# Patient Record
Sex: Male | Born: 1995
Health system: Southern US, Community
[De-identification: ages and names within clinical notes are randomized; demographics above are authoritative.]

## PROBLEM LIST (undated history)

## (undated) DIAGNOSIS — M5430 Sciatica, unspecified side: Secondary | ICD-10-CM

## (undated) DIAGNOSIS — K649 Unspecified hemorrhoids: Secondary | ICD-10-CM

## (undated) DIAGNOSIS — Z8781 Personal history of (healed) traumatic fracture: Secondary | ICD-10-CM

## (undated) DIAGNOSIS — Z87820 Personal history of traumatic brain injury: Secondary | ICD-10-CM

## (undated) DIAGNOSIS — M519 Unspecified thoracic, thoracolumbar and lumbosacral intervertebral disc disorder: Secondary | ICD-10-CM

## (undated) DIAGNOSIS — M5432 Sciatica, left side: Secondary | ICD-10-CM

## (undated) DIAGNOSIS — K219 Gastro-esophageal reflux disease without esophagitis: Secondary | ICD-10-CM

## (undated) DIAGNOSIS — M545 Low back pain, unspecified: Secondary | ICD-10-CM

## (undated) HISTORY — DX: Personal history of (healed) traumatic fracture: Z87.81

## (undated) HISTORY — DX: Unspecified hemorrhoids: K64.9

## (undated) HISTORY — DX: Low back pain, unspecified: M54.50

## (undated) HISTORY — DX: Sciatica, left side: M54.32

## (undated) HISTORY — DX: Personal history of traumatic brain injury: Z87.820

## (undated) HISTORY — DX: Gastro-esophageal reflux disease without esophagitis: K21.9

## (undated) HISTORY — DX: Sciatica, unspecified side: M54.30

## (undated) HISTORY — DX: Unspecified thoracic, thoracolumbar and lumbosacral intervertebral disc disorder: M51.9

## (undated) HISTORY — PX: TOOTH EXTRACTION: SUR596

---

## 2017-11-18 HISTORY — PX: HEMORRHOID SURGERY: SHX153

## 2020-11-21 ENCOUNTER — Other Ambulatory Visit: Payer: Self-pay

## 2020-11-21 ENCOUNTER — Other Ambulatory Visit (HOSPITAL_COMMUNITY): Payer: Self-pay | Admitting: Family Medicine

## 2020-11-21 ENCOUNTER — Other Ambulatory Visit (HOSPITAL_COMMUNITY)
Admission: RE | Admit: 2020-11-21 | Discharge: 2020-11-21 | Disposition: A | Discharge status: Home / Self Care | Payer: Medicaid Other | Source: Ambulatory Visit | Attending: Family Medicine | Admitting: Family Medicine

## 2020-11-21 ENCOUNTER — Ambulatory Visit (INDEPENDENT_AMBULATORY_CARE_PROVIDER_SITE_OTHER): Payer: Medicaid Other | Admitting: Family Medicine

## 2020-11-21 VITALS — BP 110/80 | HR 81 | Ht 67.5 in | Wt 136.2 lb

## 2020-11-21 DIAGNOSIS — Z0289 Encounter for other administrative examinations: Secondary | ICD-10-CM | POA: Insufficient documentation

## 2020-11-21 DIAGNOSIS — M5442 Lumbago with sciatica, left side: Secondary | ICD-10-CM | POA: Diagnosis not present

## 2020-11-21 DIAGNOSIS — R1013 Epigastric pain: Secondary | ICD-10-CM

## 2020-11-21 DIAGNOSIS — G8929 Other chronic pain: Secondary | ICD-10-CM | POA: Diagnosis not present

## 2020-11-21 LAB — POCT URINALYSIS DIP (MANUAL ENTRY)
Bilirubin, UA: NEGATIVE
Blood, UA: NEGATIVE
Glucose, UA: NEGATIVE mg/dL
Ketones, POC UA: NEGATIVE mg/dL
Leukocytes, UA: NEGATIVE
Nitrite, UA: NEGATIVE
Protein Ur, POC: NEGATIVE mg/dL
Spec Grav, UA: >=1.03 — AB (ref 1.010–1.025)
Urobilinogen, UA: 0.2 E.U./dL
pH, UA: 5.5 (ref 5.0–8.0)

## 2020-11-21 MED ORDER — ALBENDAZOLE 200 MG PO TABS: 400.0000 mg | ORAL_TABLET | Freq: Once | ORAL | 0 refills | Status: AC

## 2020-11-21 MED ORDER — ACETAMINOPHEN ER 650 MG PO TBCR: 650.0000 mg | EXTENDED_RELEASE_TABLET | Freq: Three times a day (TID) | ORAL | 3 refills | Status: DC | PRN

## 2020-11-21 MED ORDER — DICLOFENAC SODIUM 1 % EX CREA: 2.0000 g | TOPICAL_CREAM | Freq: Three times a day (TID) | CUTANEOUS | 3 refills | Status: DC | PRN

## 2020-11-21 MED ORDER — IVERMECTIN 3 MG PO TABS: 200.0000 ug/kg | ORAL_TABLET | Freq: Every day | ORAL | 0 refills | Status: AC

## 2020-11-21 NOTE — Patient Instructions (Addendum)
It was wonderful to see you today.  Please bring ALL of your medications with you to every visit.   Today we talked about:  --Going to the lab    ---Checking for infection of the stomach    ? ?  ?  ?  ?.   ~?  ??   ?   ?  ?.     ? ? ? ~??:  --   ?   ---  ?  ?~ ? ~  Thank you for choosing [facility].   Please call [phone] with any questions about today's appointment.  Please be sure to schedule follow up at the front  desk before you leave today.     ? ~?   ~  .   ~?   ?? ??  ?  ? ??  [phone]  ? .   ~? ??  ~? ?  ?     ~ ? ~? ? ? ~?.  Terisa Starr, MD  Family Medicine

## 2020-11-21 NOTE — Assessment & Plan Note (Addendum)
Routine labs today. Follow-up appointment 12/04/2020 to address health concerns. Offered resources for mental health concerns, patient declines at this time.
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
## 2020-11-22 LAB — CBC WITH DIFFERENTIAL/PLATELET
Basophils Absolute: 0 10*3/uL (ref 0.0–0.2)
Basos: 0 %
EOS (ABSOLUTE): 0.3 10*3/uL (ref 0.0–0.4)
Eos: 4 %
Hematocrit: 46.6 % (ref 37.5–51.0)
Hemoglobin: 16.3 g/dL (ref 13.0–17.7)
Immature Grans (Abs): 0 10*3/uL (ref 0.0–0.1)
Immature Granulocytes: 1 %
Lymphocytes Absolute: 2.8 10*3/uL (ref 0.7–3.1)
Lymphs: 43 %
MCH: 29.8 pg (ref 26.6–33.0)
MCHC: 35 g/dL (ref 31.5–35.7)
MCV: 85 fL (ref 79–97)
Monocytes Absolute: 0.4 10*3/uL (ref 0.1–0.9)
Monocytes: 7 %
Neutrophils Absolute: 3 10*3/uL (ref 1.4–7.0)
Neutrophils: 45 %
Platelets: 224 10*3/uL (ref 150–450)
RBC: 5.47 x10E6/uL (ref 4.14–5.80)
RDW: 12.5 % (ref 11.6–15.4)
WBC: 6.5 10*3/uL (ref 3.4–10.8)

## 2020-11-22 LAB — COMPREHENSIVE METABOLIC PANEL
ALT: 27 IU/L (ref 0–44)
AST: 22 IU/L (ref 0–40)
Albumin/Globulin Ratio: 1.6 (ref 1.2–2.2)
Albumin: 4.7 g/dL (ref 4.1–5.2)
Alkaline Phosphatase: 59 IU/L (ref 44–121)
BUN/Creatinine Ratio: 15 (ref 9–20)
BUN: 11 mg/dL (ref 6–20)
Bilirubin Total: 0.3 mg/dL (ref 0.0–1.2)
CO2: 24 mmol/L (ref 20–29)
Calcium: 9.7 mg/dL (ref 8.7–10.2)
Chloride: 102 mmol/L (ref 96–106)
Creatinine, Ser: 0.73 mg/dL — ABNORMAL LOW (ref 0.76–1.27)
GFR calc Af Amer: 144 mL/min/{1.73_m2} (ref >59)
GFR calc non Af Amer: 124 mL/min/{1.73_m2} (ref >59)
Globulin, Total: 2.9 g/dL (ref 1.5–4.5)
Glucose: 88 mg/dL (ref 65–99)
Potassium: 4.9 mmol/L (ref 3.5–5.2)
Sodium: 140 mmol/L (ref 134–144)
Total Protein: 7.6 g/dL (ref 6.0–8.5)

## 2020-11-22 LAB — HEPATITIS B SURFACE ANTIBODY, QUANTITATIVE: Hepatitis B Surf Ab Quant: <3.1 m[IU]/mL — ABNORMAL LOW (ref >9.9)

## 2020-11-22 LAB — LIPID PANEL
Chol/HDL Ratio: 4.3 ratio (ref 0.0–5.0)
Cholesterol, Total: 173 mg/dL (ref 100–199)
HDL: 40 mg/dL (ref >39)
LDL Chol Calc (NIH): 100 mg/dL — ABNORMAL HIGH (ref 0–99)
Triglycerides: 189 mg/dL — ABNORMAL HIGH (ref 0–149)
VLDL Cholesterol Cal: 33 mg/dL (ref 5–40)

## 2020-11-22 LAB — VARICELLA ZOSTER ANTIBODY, IGG: Varicella zoster IgG: 1245 index (ref >165)

## 2020-11-22 LAB — H. PYLORI BREATH TEST: H pylori Breath Test: POSITIVE — AB

## 2020-11-22 LAB — HEPATITIS B CORE ANTIBODY, TOTAL: Hep B Core Total Ab: NEGATIVE

## 2020-11-22 LAB — HCV AB W REFLEX TO QUANT PCR: HCV Ab: <0.1 s/co ratio (ref 0.0–0.9)

## 2020-11-22 LAB — HIV ANTIBODY (ROUTINE TESTING W REFLEX): HIV Screen 4th Generation wRfx: NONREACTIVE

## 2020-11-22 LAB — HEPATITIS B SURFACE ANTIGEN: Hepatitis B Surface Ag: NEGATIVE

## 2020-11-22 LAB — TSH: TSH: 2.72 u[IU]/mL (ref 0.450–4.500)

## 2020-11-22 LAB — RPR: RPR Ser Ql: NONREACTIVE

## 2020-11-22 LAB — HCV INTERPRETATION

## 2020-11-23 LAB — URINE CYTOLOGY ANCILLARY ONLY
Chlamydia: NEGATIVE
Comment: NEGATIVE
Comment: NORMAL
Neisseria Gonorrhea: NEGATIVE

## 2020-11-24 ENCOUNTER — Telehealth: Payer: Self-pay | Admitting: Family Medicine

## 2020-11-24 DIAGNOSIS — A048 Other specified bacterial intestinal infections: Secondary | ICD-10-CM | POA: Insufficient documentation

## 2020-11-24 HISTORY — DX: Other specified bacterial intestinal infections: A04.8

## 2020-11-24 NOTE — Telephone Encounter (Signed)
Attempted to call-- H. Pylori positive, no Pashto interpreter available.  Will attempt to call next week with results. Will need to confirm allergies and prior experience with macrolides.   Terisa Starr, MD  Family Medicine Teaching Service

## 2020-11-29 ENCOUNTER — Encounter: Payer: Self-pay | Admitting: Family Medicine

## 2020-11-30 ENCOUNTER — Telehealth: Payer: Self-pay | Admitting: Family Medicine

## 2020-11-30 ENCOUNTER — Encounter: Payer: Self-pay | Admitting: Family Medicine

## 2020-11-30 DIAGNOSIS — A048 Other specified bacterial intestinal infections: Secondary | ICD-10-CM

## 2020-11-30 MED ORDER — OMEPRAZOLE 20 MG PO CPDR
20.0000 mg | DELAYED_RELEASE_CAPSULE | Freq: Two times a day (BID) | ORAL | 0 refills | Status: DC
Start: 2020-11-30 — End: 2020-12-08

## 2020-11-30 MED ORDER — PYLERA 140-125-125 MG PO CAPS: 3.0000 | ORAL_CAPSULE | Freq: Three times a day (TID) | ORAL | 0 refills | Status: DC

## 2020-11-30 NOTE — Telephone Encounter (Signed)
Attempted to use phone interpreter service to call patient regarding H. Pylori positive results.  Unfortunately, after over 20 minutes of holding, no Pashto interpreter was available. Wanted to confirm patient's allergies prior to prescribing bismuth quadruple therapy for H. Pylori infection.  Per documentation from initial visit on 11/21/2020, patient reported having no allergies to foods or medicines.   I will proceed with prescribing bismuth quadruple therapy with Pylera combo pill TID with meals plus QHS and omeprazole BID. Will send letter in English and Pashto (per google translate) explaining therapy regimen. I am unable to write medication bottle instructions in Pashto unfortunately.  I have contacted the pharmacy about printing their medication instructions in Pashto; they are unable to do so.   Fayette Pho, MD

## 2020-11-30 NOTE — Progress Notes (Signed)
Results letter covering positive H. Pylori test result and treatment. Typed in [place], used Google translation to attempt written Pashto translation. Unfortunately, [facility] system does not yet have written translation services.   Fayette Pho, MD

## 2020-12-01 ENCOUNTER — Telehealth: Payer: Self-pay | Admitting: Family Medicine

## 2020-12-01 NOTE — Telephone Encounter (Signed)
Attempted to use the interpreter line again to have Pashto interpreter to explain positive H. Pylori results and treatment regimen. No Pashto interpreter available at this time. Per interpreter service, the only times of day they are more available are "really early or really late" due to high demand.   Will try again later. Letter in both [place] and [place] (courtesy of Google translate) mailed yesterday 1/13.  Fayette Pho, MD

## 2020-12-04 ENCOUNTER — Ambulatory Visit: Payer: Medicaid Other | Admitting: Family Medicine

## 2020-12-08 ENCOUNTER — Other Ambulatory Visit: Payer: Self-pay

## 2020-12-08 ENCOUNTER — Encounter: Payer: Self-pay | Admitting: Family Medicine

## 2020-12-08 ENCOUNTER — Ambulatory Visit (INDEPENDENT_AMBULATORY_CARE_PROVIDER_SITE_OTHER): Payer: Medicaid Other | Admitting: Family Medicine

## 2020-12-08 ENCOUNTER — Other Ambulatory Visit (HOSPITAL_COMMUNITY): Payer: Self-pay | Admitting: Family Medicine

## 2020-12-08 DIAGNOSIS — M5442 Lumbago with sciatica, left side: Secondary | ICD-10-CM | POA: Diagnosis present

## 2020-12-08 DIAGNOSIS — Z23 Encounter for immunization: Secondary | ICD-10-CM | POA: Diagnosis not present

## 2020-12-08 DIAGNOSIS — G8929 Other chronic pain: Secondary | ICD-10-CM

## 2020-12-08 DIAGNOSIS — Z789 Other specified health status: Secondary | ICD-10-CM

## 2020-12-08 DIAGNOSIS — A048 Other specified bacterial intestinal infections: Secondary | ICD-10-CM | POA: Diagnosis not present

## 2020-12-08 DIAGNOSIS — Z0289 Encounter for other administrative examinations: Secondary | ICD-10-CM | POA: Diagnosis not present

## 2020-12-08 MED ORDER — OMEPRAZOLE 20 MG PO CPDR: 20.0000 mg | DELAYED_RELEASE_CAPSULE | Freq: Two times a day (BID) | ORAL | 0 refills | Status: DC

## 2020-12-08 MED ORDER — ACETAMINOPHEN ER 650 MG PO TBCR: 650.0000 mg | EXTENDED_RELEASE_TABLET | Freq: Three times a day (TID) | ORAL | 3 refills | Status: DC | PRN

## 2020-12-08 MED ORDER — DICLOFENAC SODIUM 1 % EX CREA: 2.0000 g | TOPICAL_CREAM | Freq: Three times a day (TID) | CUTANEOUS | 3 refills | Status: DC | PRN

## 2020-12-08 MED ORDER — PYLERA 140-125-125 MG PO CAPS: 3.0000 | ORAL_CAPSULE | Freq: Three times a day (TID) | ORAL | 0 refills | Status: DC

## 2020-12-08 NOTE — Patient Instructions (Addendum)
It was wonderful to see you today. Thank you for allowing me to be a part of your care. Below is a short summary of what we discussed at your visit today:    ? ?  ?  ?  ?.  ?    ~? ?   ?  . ?   ? ?? ? ? ?    ? ~?  ~?:  Hepatitis B vaccines Since your body did not respond appropriately to the previous hepatitis B vaccines, we have to restart the vaccine series.  You got your first dose of the hepatitis B vaccine.  There will be 3 vaccines total; you will need to come back in 1 month for the second shot and again in 6 months for the third shot.   ? ? ~? ?? ?    ? ? ? ~?   ?  ? ~?? ? ?  ~? ?? ? ? ~?.   ?? ? ~? ??   ~?. ??  ? ~?   ??    ?   1 ? ~? ?   ?  6 ? ~?  ? ? .  Future Appointments ~ ?? Your wife has her next appointment on Thursday, February 3 at 1:45 PM.     ?  ?  ?    3  ??  1:45     .  Both you and your wife both have appointments on Thursday, February 24 starting at 1:45 PM.  At this appointment for you, we will follow-up on your H. pylori medicine and give you your next hepatitis B vaccine.    ? ?  ?  ?     24 ??  ??  1:45 ? ~??.    ?  ~? ?    H. pylori  ? ~?      ~?  ?? ? ~? ~?.  You will need to come back in 6 months in July or August for your last hepatitis B vaccine.  We do not yet have her schedule open that far in advance.  You will need to schedule this appointment sometime in May.  ??  ?    ?? ? ~?   ? ? ? ~?  6 ? ~? ? . ?     ? ?  ?  ~??.     ?  ? ~? ? ?    ?  ?? .  Medications I have sent several medications to your pharmacy.  You will also take the paper prescriptions to the pharmacy as well.  Today, you will be able to pick up the omeprazole, diclofenac cream, and Tylenol.  The other medicines must be ordered and the pharmacy will call you to let you know when they are here.  The medicines they need to order are albendazole, ivermectin, and Pylera.       ?  ?? .    ~ ?    .  ?  ~?  ? ??~??~ ~?  ??? .   ?        ?  ?    ~? ~ ? ?  .   ? ? ??  ~  ??  ? ivermectin  Pylera .  To pick up the medications at the pharmacy today, I have printed out a letter in both [place] and Pashto that you may simply handed to the pharmacist.  This will explain who you are and what prescriptions you need.  The  pharmacy can waive your co-pay for many medications.  This means you will be able to go home with your medications at no cost to you.  I have also given you an envelope that has the money to pay for the pain reliever called Tylenol.   ?   ~?   ?    ??  ? ? ~? ? ?~  ~?? ?  ~?    ?  ?~  ~?.   ? ~? ?  ?~ ?  ~ ?  ?? .  ~?   ???    ?~ ?  ~?.  ?   ?   ~?    ~ ?? ?     ~  ? .    ?   ~?? ?  ???     ~  ?? .  Below are instructions on how to take each medicine.  ? ??  ? ??   .  H. pylori infection You will need to follow the instructions on the letter to take these medicines.  You must wait until you get both the omeprazole and the Pylera and  start them on the same day.  H. pylori ?~  ??   ?  ?   ?~ ~? ?? ? ~?.  ?  ~?   ?  ?  ? ?  ~?    ? ?? ? ~?.  If you have any questions or concerns, please do not hesitate to contact us via phone or MyChart message.  ~  ~ ? ? ??   ~?  ? ? MyChart ?  ? ?  ??~ ?. Fayette Pho, MD   Tylenol is a pain reliever pill that you take by mouth.  You may take 1 tablet every 6 hours as needed for pain.  Do not take more than 3 tablets in 1 day.  ???   ~ ??  ?  ??  ?  .  ~?    6  ~? 1 ???  ~ ??    .  ? ? ~?  3 ? ?? ???  .  Diclofenac cream is a pain reliever that is applied to the skin.  You will apply it to your painful areas like your back and your knees twice daily.  You must do this every day to get relief.  Use for approximately 2 weeks at a time.  ??~??~ ~? ?  ~~? ? ?  ?~  ~??.     ? ~?  ?  ~ ? ~    ? ~?  ~?.  ?  ?  ?  ~   ~ ~?.  ?  ~?  ?? 2 ?  ~.  Albendazole you will take 2 tablets by mouth once.  That is all.   Albendazole   2 ??   ~? ? ? .  ? .  Ivermectin you will take 5 tablets by mouth today and 5 tablets by mouth tomorrow.  Since your wife weighs less than you, she will take 4 tablets by mouth today and 4 tablets by mouth tomorrow.  Ivermectin    5 ??  ?  ?  5 ??   ?  ? . ?? ?  ??    ~      4 ??  ?  ?    ?  ? 4 ?? .

## 2020-12-13 DIAGNOSIS — Z789 Other specified health status: Secondary | ICD-10-CM

## 2020-12-13 HISTORY — DX: Other specified health status: Z78.9

## 2020-12-13 NOTE — Assessment & Plan Note (Signed)
Discussed with patient, he is amenable to repeating Hep. B vaccine series. Administered initial vaccine at this visit. Will return in one month and six months for additional series doses.

## 2020-12-13 NOTE — Progress Notes (Signed)
    SUBJECTIVE:   CHIEF COMPLAINT / HPI:   Active H. Pylori infection Patient presents for follow up from his initial refugee health examination. At that time, he complained of abdominal pain and dyspepsia; he subsequently had a positive breath test for H. Pylori. Multiple attempts to call him via phone with interpreter were foiled due to unavailability of Pashto interpreter. A letter was sent to his home in Pashto explaining the treatment regimen; however, he has not yet received this letter. With the interpreter, I was able to explain the positive test findings and the 10-day treatment regimen.   Low Hepatitis B titers At his initial testing, his Hepatitis B antibody titers were low despite previous vaccination. He will need to restart the Hep B vaccine series today and return in one month and six months for the rest of the series. This was discussed with patient who was amenable. All questions were answered.   PERTINENT  PMH / PSH: H. Pylori positive  OBJECTIVE:   BP 100/68   Pulse 76   Wt 138 lb 6.4 oz (62.8 kg)   SpO2 96%   BMI 21.36 kg/m    PHQ-9:  Depression screen [facility] 2/9 11/21/2020  Decreased Interest 0  Down, Depressed, Hopeless 0  PHQ - 2 Score 0  Altered sleeping 0  Tired, decreased energy 0  Change in appetite 0  Feeling bad or failure about yourself  0  Trouble concentrating 0  Moving slowly or fidgety/restless 0  Suicidal thoughts 0  PHQ-9 Score 0  Difficult doing work/chores Not difficult at all     GAD-7: No flowsheet data found.    Physical Exam General: Awake, alert, oriented, no acute distress Respiratory: Unlabored breathing, no respiratory distress, speaking in full sentences Neuro: Cranial nerves II through X grossly intact, able to move all extremities spontaneously   ASSESSMENT/PLAN:   H. pylori infection Prescribed 10-day course of omeprazole and Pylera. Regimen explained with interpreter and printed handout with Pashto was provided. All  questions answered. Will follow up in four weeks for repeat breath test to ensure H. Pylori eradication.   Nonimmune to hepatitis B virus Discussed with patient, he is amenable to repeating Hep. B vaccine series. Administered initial vaccine at this visit. Will return in one month and six months for additional series doses.   Refugee health examination Due to inability to pay copays at pharmacy, patient had not yet filled ivermectin or albendazole. Sent to [facility] outpatient pharmacy; explained situation to pharmacy tech who was able to waive fees per pharmacy policy for Medicaid insured patients.      Fayette Pho, MD [facility]

## 2020-12-13 NOTE — Assessment & Plan Note (Signed)
Due to inability to pay copays at pharmacy, patient had not yet filled ivermectin or albendazole. Sent to [facility] outpatient pharmacy; explained situation to pharmacy tech who was able to waive fees per pharmacy policy for Medicaid insured patients.

## 2020-12-13 NOTE — Assessment & Plan Note (Signed)
Prescribed 10-day course of omeprazole and Pylera. Regimen explained with interpreter and printed handout with Pashto was provided. All questions answered. Will follow up in four weeks for repeat breath test to ensure H. Pylori eradication.

## 2021-01-04 ENCOUNTER — Telehealth: Payer: Self-pay | Admitting: Family Medicine

## 2021-01-04 NOTE — Telephone Encounter (Signed)
Patient has an interpreter calling her name is Jeremy [name] and her number is [phone]. She would like to know if someone can call her to discuss receiving a new dental referral. The office that he was scheduled with does not provide interpreters. They understand the patient has an appointment with Dr. Larita Fife on 02/24 and will discuss this then as well but wanted to also inform her ahead of time.   Please advise.

## 2021-01-11 ENCOUNTER — Other Ambulatory Visit: Payer: Self-pay

## 2021-01-11 ENCOUNTER — Other Ambulatory Visit (HOSPITAL_COMMUNITY): Payer: Self-pay | Admitting: Family Medicine

## 2021-01-11 ENCOUNTER — Ambulatory Visit: Payer: Medicaid Other | Admitting: Family Medicine

## 2021-01-11 ENCOUNTER — Other Ambulatory Visit (HOSPITAL_COMMUNITY)
Admission: RE | Admit: 2021-01-11 | Discharge: 2021-01-11 | Disposition: A | Discharge status: Home / Self Care | Payer: Medicaid Other | Source: Ambulatory Visit | Attending: Family Medicine | Admitting: Family Medicine

## 2021-01-11 VITALS — BP 110/70 | HR 70 | Ht 67.24 in | Wt 142.0 lb

## 2021-01-11 DIAGNOSIS — R369 Urethral discharge, unspecified: Secondary | ICD-10-CM

## 2021-01-11 DIAGNOSIS — G8929 Other chronic pain: Secondary | ICD-10-CM | POA: Diagnosis not present

## 2021-01-11 DIAGNOSIS — R360 Urethral discharge without blood: Secondary | ICD-10-CM

## 2021-01-11 DIAGNOSIS — Z23 Encounter for immunization: Secondary | ICD-10-CM

## 2021-01-11 DIAGNOSIS — M5430 Sciatica, unspecified side: Secondary | ICD-10-CM | POA: Diagnosis not present

## 2021-01-11 DIAGNOSIS — R109 Unspecified abdominal pain: Secondary | ICD-10-CM

## 2021-01-11 DIAGNOSIS — Z789 Other specified health status: Secondary | ICD-10-CM | POA: Diagnosis not present

## 2021-01-11 DIAGNOSIS — R8290 Unspecified abnormal findings in urine: Secondary | ICD-10-CM | POA: Diagnosis present

## 2021-01-11 DIAGNOSIS — R10A0 Flank pain, unspecified side: Secondary | ICD-10-CM

## 2021-01-11 DIAGNOSIS — A048 Other specified bacterial intestinal infections: Secondary | ICD-10-CM

## 2021-01-11 DIAGNOSIS — M546 Pain in thoracic spine: Secondary | ICD-10-CM

## 2021-01-11 MED ORDER — LIDOPURE PATCH 5 % EX KIT: 1.0000 [IU] | PACK | Freq: Every day | CUTANEOUS | 3 refills | Status: DC | PRN

## 2021-01-11 MED FILL — LIDOCAINE PATCH 5%: 5 | 30 days supply | Qty: 30 | Fill #0

## 2021-01-11 NOTE — Patient Instructions (Signed)
It was wonderful to see you today. Thank you for allowing me to be a part of your care. Below is a short summary of what we discussed at your visit today:   ? ?  ?  ?  ?.  ?    ~? ?   ?  . ?   ? ?? ? ? ?    ? ~?  ~?:  Hepatitis B vaccine  ?? ? ~? Today you received your second dose of the hepatitis B vaccine.  You will need to come back in 6 months to receive your third and final dose of the hepatitis B vaccine.  This will be the last hepatitis B vaccine dose.  Return around the end of August or beginning of September for this vaccine.    ?? ? ~?    ~?.  ??  ?  ?? ? ~? ?  ?   ~   6 ? ~? ? .    ?? ? ~? ?  .  ? ~?   ?  ? ~? ?    ? ~? ?  .  H. pylori testing  H. pylori ?  ??~~? ? ?  Today you will undergo another breath test to see if the 10-day treatment for H. pylori was successful.  When the results come back in several days, I will be able to let you know if it worked or if we will need to treat you further.  I will send you a letter with normal results.  For abnormal results, I will give you a phone call with an explanation.  ?      ?  ~? ? ? ?  ??~~? ?  ??  ?? . ~ ? ??  ? ? ~? ?       ~? ? ?  ~ ~?? ? ~ ? ??  ?    ~?.       ?  ? ?~ .  ?  ?         ? ~?.  Back pain    I am going to try several things for your back pain.  Continue trying Tylenol to see if it will work.  I will prescribe lidocaine patches for your back.  You placed these on your skin where it hurts.  You may use one patch every day.   It works by numbing the area.        ? ? ? ~?.  ??? ?   ~? ? ? ?   ~ ~?.        ??~? ? ?? ~?.     ?~ ~? ??  ?? ~? ?? ?  ~.  ~?   ? ? ? ~.   ?  ?  ? ~ ~.  I am also going to order some x-ray imaging of your back.  It will be done at the [facility] imaging center.  I have included a map for you.  You simply walk in and let them know that you are here for.  I have included a letter with this paperwork so you may give it directly to the secretary so they know why you are there.       ?~  ~ ?   ~?.    ?? ? ~ ~?  .      ~?? .    ? ?   ?   ~? ?   ?.   ? ~ ~  ? ?~  ~?? ?   ~?   ? ~  ~? ? ?   ?  ?  ?.  Lastly, I am going to send you to our sports medicine specialists.  I will place the referral.  They will call you with an interpreter and set up an appointment for you to see them regarding her back pain.  ? ~?     ?    ?  ??.    ~?. ?     Z?~  ?       ?     ?  ?   ?~.  Please bring all of your medications to every appointment!  ~?     ?  ?!  If you have any questions or concerns, please do not hesitate to contact us via phone or MyChart message.  ~  ~ ? ? ??   ~?  ? ? MyChart ?  ? ?  ??~ ?  Fayette Pho, MD
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
## 2021-01-12 ENCOUNTER — Other Ambulatory Visit: Payer: Self-pay | Admitting: Family Medicine

## 2021-01-12 LAB — URINE CYTOLOGY ANCILLARY ONLY
Chlamydia: NEGATIVE
Comment: NEGATIVE
Comment: NEGATIVE
Comment: NORMAL
Neisseria Gonorrhea: NEGATIVE
Trichomonas: NEGATIVE

## 2021-01-12 NOTE — Telephone Encounter (Signed)
Pt had appointment yesterday with PCP.Massai Hankerson Zimmerman Rumple, CMA

## 2021-01-13 LAB — URINALYSIS, ROUTINE W REFLEX MICROSCOPIC
Bilirubin, UA: NEGATIVE
Glucose, UA: NEGATIVE
Ketones, UA: NEGATIVE
Leukocytes,UA: NEGATIVE
Nitrite, UA: NEGATIVE
Protein,UA: NEGATIVE
RBC, UA: NEGATIVE
Specific Gravity, UA: 1.017 (ref 1.005–1.030)
Urobilinogen, Ur: 0.2 mg/dL (ref 0.2–1.0)
pH, UA: 7 (ref 5.0–7.5)

## 2021-01-14 DIAGNOSIS — R360 Urethral discharge without blood: Secondary | ICD-10-CM | POA: Insufficient documentation

## 2021-01-14 DIAGNOSIS — M5430 Sciatica, unspecified side: Secondary | ICD-10-CM | POA: Insufficient documentation

## 2021-01-14 DIAGNOSIS — M549 Dorsalgia, unspecified: Secondary | ICD-10-CM | POA: Insufficient documentation
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
## 2021-01-14 NOTE — Assessment & Plan Note (Signed)
Worse with walking, running, nighttime, fatigue.  Denies saddle anesthesia or incontinence of bowel or bladder.  No difficulty ambulating. -Provided information on stretches for relief -Ibuprofen, Tylenol -If persistent, consider sports medicine referral

## 2021-01-14 NOTE — Assessment & Plan Note (Signed)
S/p 10-day quadruple therapy.  Supposed to undergo repeat breath test today however could not because of recent antacid use.  Counseled patient on avoiding antacids before his next test.  Will retry at next appointment in 4 weeks.

## 2021-01-14 NOTE — Assessment & Plan Note (Signed)
Patient reports "ejaculate-like" material from penis when urinating.  DDx UTI vs GC/CH vs retrograde ejaculation.  -UA with micro -GC/chlamydia urine cytology

## 2021-01-14 NOTE — Assessment & Plan Note (Signed)
Vaccine #2 of 3 today.  Next due in 6 months, approximately late August/early September.

## 2021-01-15 ENCOUNTER — Ambulatory Visit
Admission: RE | Admit: 2021-01-15 | Discharge: 2021-01-15 | Disposition: A | Discharge status: Home / Self Care | Payer: Medicaid Other | Source: Ambulatory Visit | Attending: Family Medicine | Admitting: Family Medicine

## 2021-01-15 DIAGNOSIS — M546 Pain in thoracic spine: Secondary | ICD-10-CM

## 2021-01-15 DIAGNOSIS — G8929 Other chronic pain: Secondary | ICD-10-CM

## 2021-01-17 ENCOUNTER — Encounter: Payer: Self-pay | Admitting: Family Medicine

## 2021-02-02 ENCOUNTER — Other Ambulatory Visit: Payer: Self-pay

## 2021-02-02 ENCOUNTER — Ambulatory Visit (INDEPENDENT_AMBULATORY_CARE_PROVIDER_SITE_OTHER): Payer: Medicaid Other | Admitting: Family Medicine

## 2021-02-02 ENCOUNTER — Other Ambulatory Visit (HOSPITAL_COMMUNITY): Payer: Self-pay | Admitting: Family Medicine

## 2021-02-02 ENCOUNTER — Encounter: Payer: Self-pay | Admitting: Family Medicine

## 2021-02-02 VITALS — BP 121/70 | HR 67 | Ht 66.0 in | Wt 139.0 lb

## 2021-02-02 DIAGNOSIS — G8929 Other chronic pain: Secondary | ICD-10-CM | POA: Diagnosis not present

## 2021-02-02 DIAGNOSIS — M5442 Lumbago with sciatica, left side: Secondary | ICD-10-CM

## 2021-02-02 MED ORDER — GABAPENTIN 100 MG PO CAPS: 100.0000 mg | ORAL_CAPSULE | Freq: Three times a day (TID) | ORAL | 3 refills | Status: DC

## 2021-02-02 NOTE — Patient Instructions (Signed)
It was great seeing you today.  I believe that you have sciatica.  I have put a referral for physical therapy and am sending in a medication called gabapentin to help with the pain.  This medication may make you sleepy so please take it at night at first and if it does not make you too sleepy you can also take 1 during the day.  You have a follow-up scheduled on March 29 with your primary care provider.  Please be sure to come to this visit.  If your symptoms worsen or you have any questions or concerns please feel free to call the clinic.  Happy of wonderful afternoon!

## 2021-02-02 NOTE — Progress Notes (Signed)
    SUBJECTIVE:   CHIEF COMPLAINT / HPI:  Left lower extremity pain An interpreter was used for this entire encounter.  Patient reports he has a long history of left leg pain which starts in his hip, goes down the back of his leg to his ankle and then to his foot.  He reports it is a burning pain.  He was given medications while in [place] that helped with the pain but he reports that when he came to the [place] he was unable to get the medications and the pain has returned and keeps getting worse and worse.  He reports it affects his ability to walk, especially when it is very bad.  He is unsure of the cause.    OBJECTIVE:   BP 121/70   Pulse 67   Ht 5\' 6"  (1.676 m)   Wt 139 lb (63 kg)   SpO2 97%   BMI 22.44 kg/m   General: Well-appearing 25 year old male, no acute distress Cardiac: Regular rate and rhythm Respiratory: Normal work of breathing MSK: Patient with tenderness to palpation at left buttocks which travels down the back of his leg to his ankle.  He reports it is a shooting pain.  Patient is able to ambulate well, no gross abnormalities or deformities noted  ASSESSMENT/PLAN:   Back pain History and physical consistent with nerve impingement in his lumbar spine versus sciatica.  We discussed possible therapies for this to help with the pain.  Referral placed for physical therapy and patient prescribed 100 mg Neurontin twice daily.  Patient has follow-up scheduled with PCP on 3/29.     4/29, MD [facility]

## 2021-02-03 NOTE — Assessment & Plan Note (Signed)
History and physical consistent with nerve impingement in his lumbar spine versus sciatica.  We discussed possible therapies for this to help with the pain.  Referral placed for physical therapy and patient prescribed 100 mg Neurontin twice daily.  Patient has follow-up scheduled with PCP on 3/29.

## 2021-02-13 ENCOUNTER — Ambulatory Visit: Payer: Medicaid Other | Admitting: Family Medicine

## 2021-02-13 NOTE — Progress Notes (Signed)
Subjective:   Patient ID: Jeremy [name]    DOB: [birth year], 25 y.o. male   MRN: [id number]  Derric Dealmeida is a 25 y.o. male with a history of sciatic leg pain, h.pylori infection, back pain, refugee here for back pain.  Chronic Back pain with radiculopathy: Prior lumbar and thoracic XR that was unremarkable for cause of back pain with radiculopathy.  Patient was seen on 02/03/21 for chronic back pain thought to be due to nerve impingement in lumbar spine vs sciatica. He was referred to PT and given gapabentin 100mg  BID. Today he notes he continues to have back pain. It has not improved. He notes he has been taking "pain medicine" that he was given without improvement. Denies fecal/urinary incontinence or saddle anesthesia.   Review of Systems:  Per HPI.   Objective:   BP 127/76   Pulse 74   Ht 5\' 8"  (1.727 m)   Wt 140 lb 6.4 oz (63.7 kg)   SpO2 96%   BMI 21.35 kg/m  Vitals and nursing note reviewed.  General: pleasant thin male, sitting comfortably in exam chair, well nourished, well developed, in no acute distress with non-toxic appearance Lungs: clear to auscultation bilaterally with normal work of breathing on room air Skin: warm, dry Extremities: warm and well perfused MSK: gait normal, see below Neuro: Alert and oriented, speech normal  Lumbar spine: - Inspection: no gross deformity or asymmetry, swelling or ecchymosis - Palpation: No TTP over the spinous processes, paraspinal muscles, or SI joints b/l. Pain to palpation of gluteus maximus on the left - ROM: full active ROM of the lumbar spine in flexion and extension without pain - Strength: 5/5 strength of lower extremity in L4-S1 nerve root distributions b/l; normal gait - Neuro: sensation intact in the L4-S1 nerve root distribution - Special testing: Positive straight leg raise, Negative FABER, (+) piriformis test   Prior Imaging:  Lumbar XR 01/15/21: FINDINGS: There is no evidence of lumbar spine  fracture. Alignment is normal. Intervertebral disc spaces are maintained. IMPRESSION: Negative.  Thoracic Spine 01/15/21: FINDINGS: There is minor broad-based dextroscoliotic curvature of the lower thoracic spine. Alignment is otherwise normal. 12 rib-bearing thoracic vertebra. Vertebral body heights are normal. Disc spaces are preserved. No evidence of focal bone lesion or bony destruction. No paravertebral soft tissue abnormality. IMPRESSION: Minor broad-based dextroscoliotic curvature of the lower thoracic spine.  Assessment & Plan:   Piriformis syndrome, left Chronic. Physical exam findings appear most consistent with piriformis syndrome. He also has large wallet that he carries in his left posterior pants pocket which I suspect is contributing. No red flag symptoms. Provided extensive eduction and recommended conservative treatment. Given history of H.pylori infection undergoing current treatment, opted to avoid NSAIDs at this time. - Recommend placing wallet in alternative location - Continue Gabapentin as prescribed - Start Tylenol 650mg  q8 hours x 2 weeks - Flexeril 5mg  TID PRN  - Provided illustrations for piriformis stretches, recommended performing 2-3 times per day - If continues to persists, I would begin to consider MRI to further evaluate lumbar spine - Patient voiced understanding and agreement with plan.  No orders of the defined types were placed in this encounter.  Meds ordered this encounter  Medications  . acetaminophen (TYLENOL 8 HOUR) 650 MG CR tablet    Sig: Take 1 tablet (650 mg total) by mouth every 8 (eight) hours as needed for pain.    Dispense:  30 tablet    Refill:  3  . cyclobenzaprine (FLEXERIL) 5  MG tablet    Sig: Take 1 tablet (5 mg total) by mouth 3 (three) times daily as needed for muscle spasms.    Dispense:  30 tablet    Refill:  1   Due to language barrier, an interpreter was present during the history-taking and subsequent discussion  (and for part of the physical exam) with this patient. Pashto Interpreter used throughout exam.     Orpah Cobb, DO PGY-3, [facility] 02/15/2021 7:40 PM

## 2021-02-14 ENCOUNTER — Other Ambulatory Visit: Payer: Self-pay

## 2021-02-14 ENCOUNTER — Other Ambulatory Visit (HOSPITAL_COMMUNITY): Payer: Self-pay | Admitting: Family Medicine

## 2021-02-14 ENCOUNTER — Ambulatory Visit (INDEPENDENT_AMBULATORY_CARE_PROVIDER_SITE_OTHER): Payer: Medicaid Other | Admitting: Family Medicine

## 2021-02-14 VITALS — BP 127/76 | HR 74 | Ht 68.0 in | Wt 140.4 lb

## 2021-02-14 DIAGNOSIS — G5702 Lesion of sciatic nerve, left lower limb: Secondary | ICD-10-CM | POA: Diagnosis present

## 2021-02-14 DIAGNOSIS — G8929 Other chronic pain: Secondary | ICD-10-CM | POA: Diagnosis not present

## 2021-02-14 DIAGNOSIS — M5442 Lumbago with sciatica, left side: Secondary | ICD-10-CM | POA: Diagnosis not present

## 2021-02-14 MED ORDER — ACETAMINOPHEN ER 650 MG PO TBCR: 650.0000 mg | EXTENDED_RELEASE_TABLET | Freq: Three times a day (TID) | ORAL | 3 refills | Status: DC | PRN

## 2021-02-14 MED ORDER — CYCLOBENZAPRINE HCL 5 MG PO TABS: 5.0000 mg | ORAL_TABLET | Freq: Three times a day (TID) | ORAL | 1 refills | Status: DC | PRN

## 2021-02-14 MED FILL — CYCLOBENZAPRINE HCL 5 MG TA: 5 | 10 days supply | Qty: 30 | Fill #0

## 2021-02-14 NOTE — Patient Instructions (Addendum)
 ~?  ??   ~?  ? ~? ? ?   Flexeril . Tylenol 650mg   ? ~? ? ? . ????  ? ~? ? ? .  ?  ? ~?  ? ? ?  ~?. ? ~?  ?  ?  ?.   ~?  ?? ?   ? ? ? ~? ~ ?? ?  .

## 2021-02-15 ENCOUNTER — Encounter: Payer: Self-pay | Admitting: Family Medicine

## 2021-02-15 NOTE — Assessment & Plan Note (Signed)
Chronic. Physical exam findings appear most consistent with piriformis syndrome. He also has large wallet that he carries in his left posterior pants pocket which I suspect is contributing. No red flag symptoms. Provided extensive eduction and recommended conservative treatment. Given history of H.pylori infection undergoing current treatment, opted to avoid NSAIDs at this time. - Recommend placing wallet in alternative location - Continue Gabapentin as prescribed - Start Tylenol 650mg  q8 hours x 2 weeks - Flexeril 5mg  TID PRN  - Provided illustrations for piriformis stretches, recommended performing 2-3 times per day - If continues to persists, I would begin to consider MRI to further evaluate lumbar spine - Patient voiced understanding and agreement with plan.

## 2021-02-22 ENCOUNTER — Other Ambulatory Visit: Payer: Self-pay

## 2021-02-22 ENCOUNTER — Ambulatory Visit: Payer: Medicaid Other | Attending: Family Medicine | Admitting: Physical Therapy

## 2021-02-22 DIAGNOSIS — M62830 Muscle spasm of back: Secondary | ICD-10-CM | POA: Insufficient documentation

## 2021-02-22 DIAGNOSIS — M5442 Lumbago with sciatica, left side: Secondary | ICD-10-CM | POA: Diagnosis present

## 2021-02-22 DIAGNOSIS — M6281 Muscle weakness (generalized): Secondary | ICD-10-CM | POA: Insufficient documentation

## 2021-02-22 DIAGNOSIS — R262 Difficulty in walking, not elsewhere classified: Secondary | ICD-10-CM | POA: Diagnosis present

## 2021-02-22 DIAGNOSIS — G8929 Other chronic pain: Secondary | ICD-10-CM | POA: Diagnosis present
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
## 2021-02-22 NOTE — Patient Instructions (Signed)
   Garen Lah, PT, ATRIC Certified Exercise Expert for the Aging Adult  02/22/21 10:57 AM Phone: [phone] Fax: [phone]

## 2021-03-14 ENCOUNTER — Ambulatory Visit: Payer: Medicaid Other

## 2021-03-14 ENCOUNTER — Other Ambulatory Visit: Payer: Self-pay

## 2021-03-14 DIAGNOSIS — G8929 Other chronic pain: Secondary | ICD-10-CM

## 2021-03-14 DIAGNOSIS — M5442 Lumbago with sciatica, left side: Secondary | ICD-10-CM | POA: Diagnosis not present

## 2021-03-14 DIAGNOSIS — R262 Difficulty in walking, not elsewhere classified: Secondary | ICD-10-CM

## 2021-03-14 DIAGNOSIS — M6281 Muscle weakness (generalized): Secondary | ICD-10-CM

## 2021-03-14 DIAGNOSIS — M62830 Muscle spasm of back: Secondary | ICD-10-CM
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
## 2021-03-16 ENCOUNTER — Ambulatory Visit: Payer: Medicaid Other | Admitting: Family Medicine

## 2021-03-17 ENCOUNTER — Other Ambulatory Visit: Payer: Self-pay

## 2021-03-17 ENCOUNTER — Ambulatory Visit: Payer: Medicaid Other

## 2021-03-17 DIAGNOSIS — G8929 Other chronic pain: Secondary | ICD-10-CM

## 2021-03-17 DIAGNOSIS — R262 Difficulty in walking, not elsewhere classified: Secondary | ICD-10-CM

## 2021-03-17 DIAGNOSIS — M6281 Muscle weakness (generalized): Secondary | ICD-10-CM

## 2021-03-17 DIAGNOSIS — M5442 Lumbago with sciatica, left side: Secondary | ICD-10-CM | POA: Diagnosis not present

## 2021-03-17 DIAGNOSIS — M62830 Muscle spasm of back: Secondary | ICD-10-CM
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
## 2021-03-20 ENCOUNTER — Ambulatory Visit: Payer: Medicaid Other | Attending: Family Medicine

## 2021-03-20 DIAGNOSIS — M6281 Muscle weakness (generalized): Secondary | ICD-10-CM | POA: Insufficient documentation

## 2021-03-20 DIAGNOSIS — M5442 Lumbago with sciatica, left side: Secondary | ICD-10-CM | POA: Insufficient documentation

## 2021-03-20 DIAGNOSIS — M62830 Muscle spasm of back: Secondary | ICD-10-CM | POA: Insufficient documentation

## 2021-03-20 DIAGNOSIS — G8929 Other chronic pain: Secondary | ICD-10-CM | POA: Insufficient documentation

## 2021-03-20 DIAGNOSIS — R262 Difficulty in walking, not elsewhere classified: Secondary | ICD-10-CM | POA: Insufficient documentation

## 2021-03-21 ENCOUNTER — Other Ambulatory Visit: Payer: Self-pay

## 2021-03-21 ENCOUNTER — Telehealth: Payer: Self-pay

## 2021-03-21 ENCOUNTER — Ambulatory Visit: Payer: Medicaid Other

## 2021-03-21 DIAGNOSIS — M62830 Muscle spasm of back: Secondary | ICD-10-CM | POA: Diagnosis present

## 2021-03-21 DIAGNOSIS — R262 Difficulty in walking, not elsewhere classified: Secondary | ICD-10-CM | POA: Diagnosis present

## 2021-03-21 DIAGNOSIS — M6281 Muscle weakness (generalized): Secondary | ICD-10-CM | POA: Diagnosis present

## 2021-03-21 DIAGNOSIS — G8929 Other chronic pain: Secondary | ICD-10-CM

## 2021-03-21 DIAGNOSIS — M5442 Lumbago with sciatica, left side: Secondary | ICD-10-CM | POA: Diagnosis present

## 2021-03-21 NOTE — Telephone Encounter (Signed)
Spoke with pt through [employer]. Pt reports he was not aware he had an appt yesterday. Pt reports because of his work schedule and financial concerns, he is not able to attend PT with the current schedule with appts at 6:30 pm. Pt reports working 12 hour shifts which does not include travel time. Pt is hoping to start working night shifts which will allow him to come to PT. Pt was able to be scheduled for a PT appt today at 2:45 pm.

## 2021-03-22 ENCOUNTER — Ambulatory Visit: Payer: Medicaid Other
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
## 2021-03-27 ENCOUNTER — Ambulatory Visit: Payer: Medicaid Other | Admitting: Physical Therapy

## 2021-03-27 ENCOUNTER — Encounter: Payer: Self-pay | Admitting: Physical Therapy

## 2021-03-27 ENCOUNTER — Other Ambulatory Visit: Payer: Self-pay

## 2021-03-27 DIAGNOSIS — M6281 Muscle weakness (generalized): Secondary | ICD-10-CM

## 2021-03-27 DIAGNOSIS — G8929 Other chronic pain: Secondary | ICD-10-CM

## 2021-03-27 DIAGNOSIS — M5442 Lumbago with sciatica, left side: Secondary | ICD-10-CM | POA: Diagnosis not present

## 2021-03-27 DIAGNOSIS — R262 Difficulty in walking, not elsewhere classified: Secondary | ICD-10-CM

## 2021-03-27 DIAGNOSIS — M62830 Muscle spasm of back: Secondary | ICD-10-CM
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
## 2021-03-29 ENCOUNTER — Ambulatory Visit: Payer: Medicaid Other

## 2021-03-31 ENCOUNTER — Ambulatory Visit: Payer: Medicaid Other

## 2021-03-31 ENCOUNTER — Other Ambulatory Visit: Payer: Self-pay

## 2021-03-31 DIAGNOSIS — M5442 Lumbago with sciatica, left side: Secondary | ICD-10-CM | POA: Diagnosis not present

## 2021-03-31 DIAGNOSIS — M6281 Muscle weakness (generalized): Secondary | ICD-10-CM

## 2021-03-31 DIAGNOSIS — R262 Difficulty in walking, not elsewhere classified: Secondary | ICD-10-CM

## 2021-03-31 DIAGNOSIS — G8929 Other chronic pain: Secondary | ICD-10-CM

## 2021-03-31 DIAGNOSIS — M62830 Muscle spasm of back: Secondary | ICD-10-CM
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
## 2021-04-05 ENCOUNTER — Encounter: Payer: Self-pay | Admitting: Physical Therapy

## 2021-04-05 ENCOUNTER — Ambulatory Visit: Payer: Medicaid Other | Admitting: Physical Therapy

## 2021-04-05 ENCOUNTER — Other Ambulatory Visit: Payer: Self-pay

## 2021-04-05 DIAGNOSIS — G8929 Other chronic pain: Secondary | ICD-10-CM

## 2021-04-05 DIAGNOSIS — R262 Difficulty in walking, not elsewhere classified: Secondary | ICD-10-CM

## 2021-04-05 DIAGNOSIS — M5442 Lumbago with sciatica, left side: Secondary | ICD-10-CM

## 2021-04-05 DIAGNOSIS — M6281 Muscle weakness (generalized): Secondary | ICD-10-CM

## 2021-04-05 DIAGNOSIS — M62830 Muscle spasm of back: Secondary | ICD-10-CM
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
## 2021-04-10 ENCOUNTER — Telehealth: Payer: Self-pay | Admitting: Physical Therapy

## 2021-04-10 ENCOUNTER — Ambulatory Visit: Payer: Medicaid Other | Admitting: Physical Therapy

## 2021-04-10 NOTE — Telephone Encounter (Signed)
Used interpreter [phone] call patient following no show today (04/10/2021) at chart phone [phone]. Patient states he called to change appointment and that he is aware of next scheduled appointment and is planning to be here 04/14/2021 at 1030.

## 2021-04-12 NOTE — Telephone Encounter (Signed)
Patient called, stated he called to reschedule the appointment.  Verified he would be at next scheduled visit.

## 2021-04-14 ENCOUNTER — Encounter: Payer: Medicaid Other | Admitting: Physical Therapy

## 2021-04-20 ENCOUNTER — Encounter: Payer: Self-pay | Admitting: Family Medicine

## 2021-04-20 ENCOUNTER — Ambulatory Visit (INDEPENDENT_AMBULATORY_CARE_PROVIDER_SITE_OTHER): Payer: Medicaid Other | Admitting: Family Medicine

## 2021-04-20 ENCOUNTER — Other Ambulatory Visit: Payer: Self-pay

## 2021-04-20 ENCOUNTER — Other Ambulatory Visit (HOSPITAL_COMMUNITY): Payer: Self-pay

## 2021-04-20 VITALS — BP 108/66 | HR 61 | Ht 68.0 in | Wt 130.2 lb

## 2021-04-20 DIAGNOSIS — H60393 Other infective otitis externa, bilateral: Secondary | ICD-10-CM

## 2021-04-20 MED ORDER — NEOMYCIN-POLYMYXIN-HC 3.5-10000-1 OT SOLN
4.0000 [drp] | Freq: Four times a day (QID) | OTIC | 0 refills | Status: DC
  Filled 2021-04-20: qty 10, 4d supply, fill #0
  Filled 2021-04-20: qty 10, 21d supply, fill #0

## 2021-04-20 MED ORDER — NAPROXEN 500 MG PO TABS
500.0000 mg | ORAL_TABLET | Freq: Two times a day (BID) | ORAL | 0 refills | Status: DC
  Filled 2021-04-20: qty 30, 15d supply, fill #0

## 2021-04-20 NOTE — Patient Instructions (Signed)
It was wonderful to see you today. Thank you for allowing me to be a part of your care. Below is a short summary of what we discussed at your visit today:    ? ?  ?  ?  ?.  ?    ~? ?   ?  . ?   ? ?? ? ? ?    ? ~?  ~?:  Ear infection  ?  You have an exterior ear infection called external otitis. I have sent in two prescriptions for you. One is an antibacterial solution called cortisporin otic. You will place 4 drops in each ear, four times per day, for seven days. If the irritation has not stopped by the seventh day, continue using for a full 14 days.     ?   ?  ?? ??.     ? ?? . ? ??  ~?   ? ? ~??? ??~ ??.     ? ~? ? ??~   ? ~? ? ?   ? . ~ ?   ?    ?   14 ?  ~   ~?.  I have also sent an anti-inflammatory medicine called naproxen to your pharmacy. It is to help improve the pain. Take it twice daily with food for 3 days. This should improve your pain.       ~?        ?? .    ? ~ ~?  ~.   ? ~?  ?  3 ?   ?  .  ?     ~?.   If you have any questions or concerns, please do not hesitate to contact us via phone or MyChart message.   Fayette Pho, MD

## 2021-04-23 ENCOUNTER — Other Ambulatory Visit (HOSPITAL_COMMUNITY): Payer: Self-pay

## 2021-04-23 ENCOUNTER — Encounter: Payer: Self-pay | Admitting: Family Medicine

## 2021-04-23 DIAGNOSIS — H6090 Unspecified otitis externa, unspecified ear: Secondary | ICD-10-CM | POA: Insufficient documentation

## 2021-04-23 NOTE — Progress Notes (Signed)
    SUBJECTIVE:   CHIEF COMPLAINT / HPI:   In-person Pashto interpreter used for entirety of clinic visit.   Ear pain - bilateral ear canal pain - around one year duration, worsened over last 2 months - initially treated in [place] with prescription ear drops but nothing has improved  - unsure of names of previous treatments - pruritic ear pain bilaterally - when cleaning ears, sometimes retrieves yellow or black exudate - no hearing loss or changes - no balance issues - no pain of pre-auricular space, jaw, maxilla - discomfort worsened at work; loud machines, needs to wear hearing protection both foam inserts and overlying ear muffs  PERTINENT  PMH / PSH: H. Pylori infection (treated), back pain, piriformis syndrome, sciatic leg pain  OBJECTIVE:   BP 108/66   Pulse 61   Ht 5\' 8"  (1.727 m)   Wt 130 lb 3.2 oz (59.1 kg)   SpO2 97%   BMI 19.80 kg/m    PHQ-9:  Depression screen [facility] 2/9 04/20/2021 11/21/2020  Decreased Interest 0 0  Down, Depressed, Hopeless 0 0  PHQ - 2 Score 0 0  Altered sleeping 0 0  Tired, decreased energy 0 0  Change in appetite 0 0  Feeling bad or failure about yourself  0 0  Trouble concentrating 0 0  Moving slowly or fidgety/restless 0 0  Suicidal thoughts 0 0  PHQ-9 Score 0 0  Difficult doing work/chores - Not difficult at all     GAD-7: No flowsheet data found.   Physical Exam General: Awake, alert, oriented HEENT: erythematous external auditory canals bilaterally with flaking skin overlying, bilat TM pearly pink and flat with appropriate cone of light Cardiovascular: Regular rate and rhythm, S1 and S2 present, no murmurs auscultated Respiratory: Lung fields clear to auscultation bilaterally  ASSESSMENT/PLAN:   External otitis Likely bacterial in nature. No specific concern for Pseudomonas (no frequent water exposure for work or pleasure, no greenish exudate). Unknown names and types of previous treatments. Will trial 7-14 day course of  cortisporin otic and 3 day course of naproxen. Return precautions given. If no improvement, will consider other etiologies such as fungal.      01/19/2021, MD [facility]

## 2021-04-23 NOTE — Assessment & Plan Note (Addendum)
Likely bacterial in nature. No specific concern for Pseudomonas (no frequent water exposure for work or pleasure, no greenish exudate). Unknown names and types of previous treatments. Will trial 7-14 day course of cortisporin otic and 3 day course of naproxen. Return precautions given. If no improvement, will consider other etiologies such as fungal.

## 2021-06-09 IMAGING — CR DG THORACIC SPINE 2V
2 series · 2 of 2 positions shown · non-contrast
Comparison: None.

CLINICAL DATA: Chronic midline thoracic back pain.

EXAM:
THORACIC SPINE 2 VIEWS

[t t-spine a.p.]
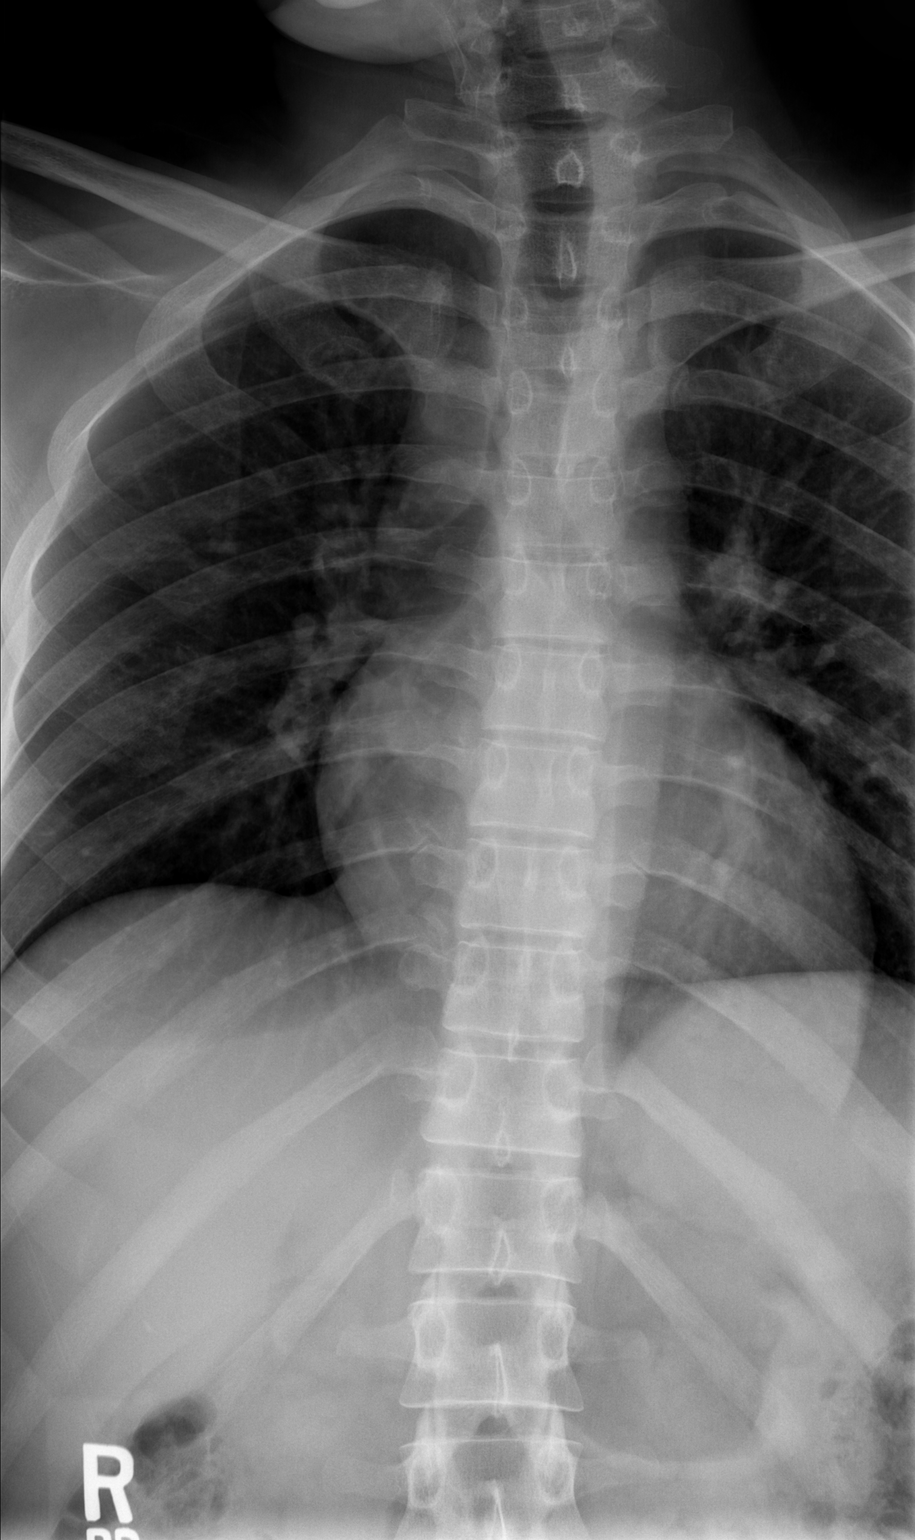

[t t-spine lat *]
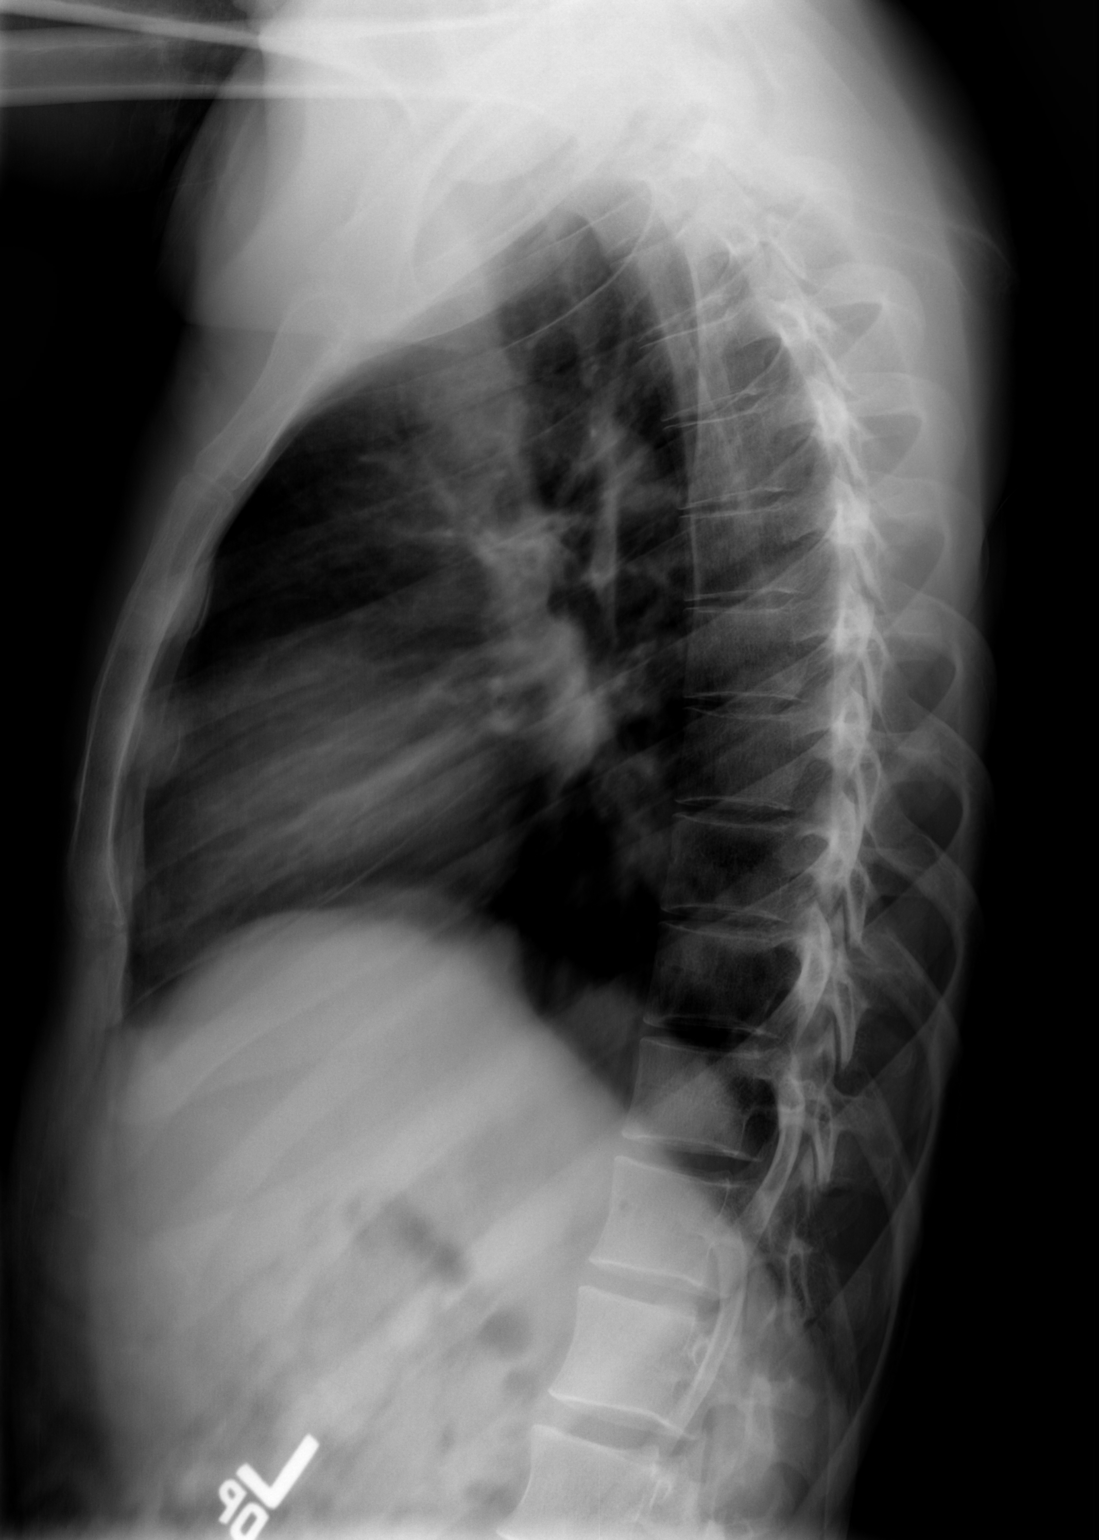

[2 of 2 positions shown; findings below may reference images not displayed]

FINDINGS: There is minor broad-based dextroscoliotic curvature of the lower
thoracic spine. Alignment is otherwise normal. 12 rib-bearing
thoracic vertebra. Vertebral body heights are normal. Disc spaces
are preserved. No evidence of focal bone lesion or bony destruction.
No paravertebral soft tissue abnormality.
IMPRESSION: Minor broad-based dextroscoliotic curvature of the lower thoracic
spine.

## 2021-07-17 ENCOUNTER — Other Ambulatory Visit: Payer: Self-pay

## 2021-07-17 ENCOUNTER — Ambulatory Visit: Payer: Medicaid Other | Admitting: Family Medicine

## 2021-07-18 ENCOUNTER — Encounter: Payer: Self-pay | Admitting: Family Medicine

## 2021-07-18 ENCOUNTER — Other Ambulatory Visit (HOSPITAL_COMMUNITY): Payer: Self-pay

## 2021-07-18 ENCOUNTER — Ambulatory Visit (INDEPENDENT_AMBULATORY_CARE_PROVIDER_SITE_OTHER): Payer: Medicaid Other | Admitting: Family Medicine

## 2021-07-18 ENCOUNTER — Other Ambulatory Visit: Payer: Self-pay

## 2021-07-18 VITALS — BP 107/72 | HR 58 | Ht 68.0 in | Wt 132.3 lb

## 2021-07-18 DIAGNOSIS — M5430 Sciatica, unspecified side: Secondary | ICD-10-CM

## 2021-07-18 MED ORDER — GABAPENTIN 100 MG PO CAPS
100.0000 mg | ORAL_CAPSULE | Freq: Three times a day (TID) | ORAL | 3 refills | Status: DC
  Filled 2021-07-18: qty 120, 30d supply, fill #0

## 2021-07-18 NOTE — Patient Instructions (Signed)
It was a pleasure seeing you today.  I am sorry you are still having problems with his left leg pain.  I know you have already been seen by physical therapy but I am going to refer you to sports medicine.  They may be able to ultrasound your leg to find a cause for this pain but you may also need an MRI.  If your symptoms worsen or you have any other please feel free to call the clinic.  I hope you have a wonderful afternoon!

## 2021-07-18 NOTE — Progress Notes (Signed)
    SUBJECTIVE:   CHIEF COMPLAINT / HPI:   Left leg and hip pain  Patient reports that he is still having issues with his chronic left lower extremity pain.  This pain has been bothering him for almost a year.  He reports the pain is worse when he is standing for long periods of time and starts of his hip and goes down the back of his left leg all the way down to his ankle.  He is taking Neurontin 3 times daily for it with mild relief.  He has been doing physical therapy but reports the physical therapy makes it worse.  Has had an x-ray of his lumbar spine with no acute findings.  Denies any red flag symptoms such as loss of bowel or bladder function.  He is concerned because it is affecting his ability to work.  OBJECTIVE:   BP 107/72   Pulse (!) 58   Ht 5\' 8"  (1.727 m)   Wt 132 lb 4.4 oz (60 kg)   SpO2 99%   BMI 20.11 kg/m   General: Well-appearing 25 year old male, no acute distress MSK: Patient with full strength in his lower extremities bilaterally.  Reports a burning pain that shoots from his hip to his ankle when palpated.  Able to ambulate without difficulty.  Full range of motion of hip and knee.  ASSESSMENT/PLAN:   Sciatic leg pain Continued issues with left lower extremity pain.  Worse with ambulation, especially at work.  Denies any red flag symptoms such as loss of bowel or bladder function.  Reports that he has been doing physical therapy but has stopped because it was causing worse pain. - Recommended continued use of Neurontin and increase nightly dose - Will refer to sports medicine for further evaluation - Continue ibuprofen or Tylenol as needed. - Strict ED precautions given.     30, MD [facility]

## 2021-07-19 NOTE — Assessment & Plan Note (Signed)
Continued issues with left lower extremity pain.  Worse with ambulation, especially at work.  Denies any red flag symptoms such as loss of bowel or bladder function.  Reports that he has been doing physical therapy but has stopped because it was causing worse pain. - Recommended continued use of Neurontin and increase nightly dose - Will refer to sports medicine for further evaluation - Continue ibuprofen or Tylenol as needed. - Strict ED precautions given.

## 2021-07-20 ENCOUNTER — Other Ambulatory Visit (HOSPITAL_COMMUNITY): Payer: Self-pay

## 2021-07-20 ENCOUNTER — Ambulatory Visit: Payer: Medicaid Other | Admitting: Family Medicine

## 2021-07-20 ENCOUNTER — Other Ambulatory Visit: Payer: Self-pay

## 2021-07-20 ENCOUNTER — Encounter: Payer: Self-pay | Admitting: Family Medicine

## 2021-07-20 VITALS — Ht 68.0 in | Wt 132.0 lb

## 2021-07-20 DIAGNOSIS — M5416 Radiculopathy, lumbar region: Secondary | ICD-10-CM

## 2021-07-20 DIAGNOSIS — M5430 Sciatica, unspecified side: Secondary | ICD-10-CM | POA: Diagnosis not present

## 2021-07-20 MED ORDER — PREDNISONE 50 MG PO TABS
50.0000 mg | ORAL_TABLET | Freq: Every day | ORAL | 0 refills | Status: DC
  Filled 2021-07-20: qty 5, 5d supply, fill #0

## 2021-07-20 MED ORDER — GABAPENTIN 100 MG PO CAPS: 100.0000 mg | ORAL_CAPSULE | Freq: Three times a day (TID) | ORAL | 3 refills | Status: DC

## 2021-07-20 NOTE — Progress Notes (Signed)
  Jeremy [name] - 25 y.o. male MRN [id number]  Date of birth: Mar 17, 1996    SUBJECTIVE:      Chief Complaint:/ HPI:  Entire office visit performed with use of in person interpreter 7 8 months of low back pain with radiation down the left lower extremity.  Severe.  Is getting worse in the last month or so.  Initially was able to do his job where he stands most of his shift without a lot of pain but recently he is having severe pain with standing.  He also has some pain at night lying down but it is less.  Some pain with sitting but that is also less.  Pain starts in the low back radiates into the left buttock and down to the left knee always.  At times it also continues down to the sole of his foot.  He had no change in bowel or bladder habits and has not noticed any weakness in the leg although it "feels funny"    OBJECTIVE: Ht 5\' 8"  (1.727 m)   Wt 132 lb (59.9 kg)   BMI 20.07 kg/m   Physical Exam:  Vital signs are reviewed. Well-developed, no acute distress. BACK: No defect.  Mild muscular spasm in the bilateral lumbar area.  Nontender to palpation or percussion.  The path of his pain that he outlines his sciatic nerve on the left. EXTREMITY: Lower extremity strength is 5 out of 5 flexion extension hip knee ankle foot.   NEURO: DTRs are 2+ bilateral symmetrical knee and ankle.  Normal muscle bulk and tone.  He has intact sensation to soft touch bilaterally.  He has positive straight leg raise on the left in seated and supine positions. Reviewed his lumbar x-rays from January 15, 2021.  I agree with normal disc spaces and vertebral heights.  There is no focal bony lesion or soft tissue abnormality.  They noted some dextroscoliotic curvature of the lower thoracic spine which I did not appreciate. ASSESSMENT & PLAN:  See problem based charting & AVS for pt instructions. Sciatic leg pain This is been going at least 7 months.  It is progressing to the point where it is interfering with his  work and his sleep.  I will treat him with a 5-day steroid burst to see if we can get ahead of the pain.  He is on a small amount gabapentin so we will increase that.  He is only taking it at night and currently taking 200 mg.  Will increase to 400 mg for the next week and then increase to 600 mg until I see him back.  This was relayed to him through the interpreter and he expressed understanding.  We will also set him up for MRI.  We have scheduled an appointment back in my clinic after the MRI.  If he has new or worsening symptoms in the interim, he should contact the office or go to the ED.

## 2021-07-20 NOTE — Assessment & Plan Note (Signed)
This is been going at least 7 months.  It is progressing to the point where it is interfering with his work and his sleep.  I will treat him with a 5-day steroid burst to see if we can get ahead of the pain.  He is on a small amount gabapentin so we will increase that.  He is only taking it at night and currently taking 200 mg.  Will increase to 400 mg for the next week and then increase to 600 mg until I see him back.  This was relayed to him through the interpreter and he expressed understanding.  We will also set him up for MRI.  We have scheduled an appointment back in my clinic after the MRI.  If he has new or worsening symptoms in the interim, he should contact the office or go to the ED.

## 2021-07-25 ENCOUNTER — Telehealth: Payer: Self-pay | Admitting: Family Medicine

## 2021-07-25 NOTE — Telephone Encounter (Signed)
Received message from patient's caseworker, Maralyn Sago, who reports his MRI is not covered by insuranc eat the current location.  The patient has healthy blue and most likely this would be covered if completed at [facility].  Will route to sports medicine clinic and ordering provider.  Will likely need to be rescheduled at [facility].  Appreciate their care and consultation for this patient.  Please call patient and caseworker with updated time and location of MRI if able to change.  Terisa Starr, MD  Family Medicine Teaching Service

## 2021-07-27 ENCOUNTER — Ambulatory Visit: Payer: Medicaid Other

## 2021-07-30 ENCOUNTER — Other Ambulatory Visit: Payer: Medicaid Other

## 2021-08-03 ENCOUNTER — Other Ambulatory Visit: Payer: Self-pay

## 2021-08-03 ENCOUNTER — Ambulatory Visit: Payer: Medicaid Other | Admitting: Family Medicine

## 2021-08-03 ENCOUNTER — Ambulatory Visit
Admission: RE | Admit: 2021-08-03 | Discharge: 2021-08-03 | Disposition: A | Discharge status: Home / Self Care | Payer: Medicaid Other | Source: Ambulatory Visit | Attending: Family Medicine | Admitting: Family Medicine

## 2021-08-03 DIAGNOSIS — M5416 Radiculopathy, lumbar region: Secondary | ICD-10-CM

## 2021-08-10 ENCOUNTER — Ambulatory Visit: Payer: Medicaid Other | Admitting: Family Medicine

## 2021-08-10 ENCOUNTER — Encounter: Payer: Self-pay | Admitting: Family Medicine

## 2021-08-10 VITALS — Ht 68.0 in | Wt 132.0 lb

## 2021-08-10 DIAGNOSIS — M5442 Lumbago with sciatica, left side: Secondary | ICD-10-CM

## 2021-08-10 DIAGNOSIS — G8929 Other chronic pain: Secondary | ICD-10-CM

## 2021-08-10 DIAGNOSIS — M533 Sacrococcygeal disorders, not elsewhere classified: Secondary | ICD-10-CM | POA: Diagnosis not present

## 2021-08-10 MED ORDER — METHYLPREDNISOLONE ACETATE 40 MG/ML IJ SUSP
40.0000 mg | Freq: Once | INTRAMUSCULAR | Status: AC
  Administered 2021-08-10: 40 mg via INTRA_ARTICULAR

## 2021-08-10 NOTE — Progress Notes (Signed)
[facility]: Attending Note: I have reviewed the chart, discussed wit the Sports Medicine Fellow. I agree with assessment and treatment plan as detailed in the Fellow's note. MRI was negative.  This was surprising as his symptoms seemed very consistent with herniated disc.  We will try ultrasound-guided injection of sacroiliac joint today.  Have him follow-up.  If this is not beneficial, could consider CT-guided sacroiliac joint injection although per the ultrasound, we were in good position today and I feel like this procedure itself was very successful.  He is a bit of a possible, as his pain seems consistent with radiculopathy but we have had difficulty finding the source.
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
## 2021-08-14 ENCOUNTER — Other Ambulatory Visit: Payer: Self-pay | Admitting: Sports Medicine

## 2021-08-14 ENCOUNTER — Ambulatory Visit (INDEPENDENT_AMBULATORY_CARE_PROVIDER_SITE_OTHER): Payer: Medicaid Other | Admitting: Sports Medicine

## 2021-08-14 VITALS — Ht 68.0 in | Wt 132.0 lb

## 2021-08-14 DIAGNOSIS — G8929 Other chronic pain: Secondary | ICD-10-CM

## 2021-08-14 DIAGNOSIS — M5430 Sciatica, unspecified side: Secondary | ICD-10-CM

## 2021-08-14 DIAGNOSIS — M533 Sacrococcygeal disorders, not elsewhere classified: Secondary | ICD-10-CM | POA: Diagnosis not present

## 2021-08-14 NOTE — Patient Instructions (Signed)
  We are going to order another injection of your low back.  This plan will be done at [facility] imaging.  They will call you to schedule it. Keep your follow-up appointment with Dr. Jennette Kettle.  However, if this second injection does not help, I want you to call my office so that I can order an EMG/nerve conduction study to look at the nerves in your left leg.

## 2021-08-14 NOTE — Progress Notes (Signed)
   Subjective:    Patient ID: Jeremy [name], male    DOB: 1996-06-03, 25 y.o.   MRN: [id number]  HPI  Patient comes in today for follow-up on chronic left-sided low back pain and left leg radiculopathy.  He has been treated by Dr. Jennette Kettle for this.  He was given a left-sided SI joint injection under ultrasound on September 23.  He does note 1 to 2 days of symptom relief with the injection.  However his pain returned shortly thereafter.  Although he does experience some left-sided low back pain, his main complaint is a burning pain in the posterior left leg which will radiate into the left foot.  He also endorses numbness in this foot.  His symptoms are worse with walking.  Although they have been present for several months, they seem to have worsened over the past few weeks.  A recent MRI of his lumbar spine was fairly unremarkable.  He is here today with an interpreter.    Review of Systems As above    Objective:   Physical Exam  Well-developed, well-nourished.  No acute distress  Lumbar spine: Fairly good lumbar mobility.  Slight tenderness to palpation of the left SI joint.  No spasm.  Neurological exam: Strength is 5/5 in both lower extremities.  Sensation is intact to light touch grossly.  No obvious atrophy.      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  However, if his symptoms do not improve with the second injection I think we should consider an EMG/nerve conduction study to rule out other neuropathic causes of his left leg pain given his unremarkable MRI.  Patient is in agreement with that plan.  He has a scheduled follow-up with Dr. Jennette Kettle on October 21 but if he does not notice improvement with his repeat injection, he will call me so that I  can schedule the nerve conduction study prior to that follow-up visit.  Patient voiced his understanding of these instructions with the help of the interpreter.  This note was dictated using Dragon naturally speaking software and may contain errors in syntax, spelling, or content which have not been identified prior to signing this note.

## 2021-08-17 ENCOUNTER — Ambulatory Visit: Payer: Medicaid Other | Admitting: Family Medicine

## 2021-09-07 ENCOUNTER — Other Ambulatory Visit (HOSPITAL_COMMUNITY): Payer: Self-pay

## 2021-09-07 ENCOUNTER — Ambulatory Visit: Payer: Medicaid Other | Admitting: Family Medicine

## 2021-09-07 VITALS — BP 112/78 | Ht 68.0 in | Wt 145.5 lb

## 2021-09-07 DIAGNOSIS — M5430 Sciatica, unspecified side: Secondary | ICD-10-CM

## 2021-09-07 DIAGNOSIS — G8929 Other chronic pain: Secondary | ICD-10-CM | POA: Diagnosis not present

## 2021-09-07 DIAGNOSIS — M533 Sacrococcygeal disorders, not elsewhere classified: Secondary | ICD-10-CM

## 2021-09-07 MED ORDER — AMITRIPTYLINE HCL 75 MG PO TABS
75.0000 mg | ORAL_TABLET | Freq: Every day | ORAL | 1 refills | Status: DC
  Filled 2021-09-07: qty 30, 30d supply, fill #0

## 2021-09-07 NOTE — Patient Instructions (Addendum)
I'm  sending in an Rx for amitriptyline as we discussed. Take it daily before bed time. Call me if you have problems with it.  I am setting up appt with Dr. Katrinka Blazing  See me in 4-5 weeks.

## 2021-09-08 NOTE — Progress Notes (Signed)
  Jeremy [name] - 25 y.o. male MRN [id number]  Date of birth: April 26, 1996    SUBJECTIVE:      Chief Complaint:/ HPI:    In person interpretor used for entire visit. F/u left sided sciatic nerve type pain. Gabapentin not really helping. Started taking it only intermittently and really could not tell much difference.  Frustrated we have not been able to help him with the pain. It is still daily.    OBJECTIVE: BP 112/78   Ht 5\' 8"  (1.727 m)   Wt 145 lb 8.1 oz (66 kg)   BMI 22.12 kg/m   Physical Exam:  Vital signs are reviewed. WD WN NAD Back nontender to palpation Lower extremity strength 5/5 hips/knees/ankles  ASSESSMENT & PLAN:  See problem based charting & AVS for pt instructions. 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 I reviewed his MRI images and some diagrams of Si joint location which is close to scitaic nerve using anatomic diagrams and models.I assured him I believed he has pain and that we were trying to help.   We did SI join injection under here and it helped only 2 days. At last ov, we discussed possibly having that repeated under fluoroscopy (although review of our [place] pics of needle placement and his brief improvement suggests to me we were successful with that procedure). I suspect his SI issues are so chronic that further injection may not offer long lasting relief. He  Has done formal PT without benefit previously. I will refer for OMM.   Will d/cscheduled  gabapentin altho he can still use it PRN if he wishes, and try adding nightly amitriptyline. He will f/u with me in 4-6 weeks.  Spend 35 minutes in face to face review and discussion regarding these issues.

## 2021-09-08 NOTE — Assessment & Plan Note (Addendum)
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 I reviewed his MRI images and some diagrams of Si joint location which is close to scitaic nerve using anatomic diagrams and models.I assured him I believed he has pain and that we were trying to help.   We did SI join injection under [place] here and it helped only 2 days. At last ov, we discussed possibly having that repeated under fluoroscopy (although review of our [place] pics of needle placement and his brief improvement suggests to me we were successful with that procedure). I suspect his SI issues are so chronic that further injection may not offer long lasting relief. He  Has done formal PT without benefit previously. I will refer for OMM.   Will d/cscheduled  gabapentin altho he can still use it PRN if he wishes, and try adding nightly amitriptyline. He will f/u with me in 4-6 weeks.  Spend 35 minutes in face to face review and discussion regarding these issues.

## 2021-10-05 ENCOUNTER — Ambulatory Visit: Payer: Medicaid Other | Admitting: Family Medicine

## 2021-10-05 VITALS — BP 102/74 | Ht 68.0 in | Wt 145.0 lb

## 2021-10-05 DIAGNOSIS — M5430 Sciatica, unspecified side: Secondary | ICD-10-CM | POA: Diagnosis not present

## 2021-10-05 DIAGNOSIS — G8929 Other chronic pain: Secondary | ICD-10-CM

## 2021-10-05 DIAGNOSIS — M533 Sacrococcygeal disorders, not elsewhere classified: Secondary | ICD-10-CM

## 2021-10-05 NOTE — Progress Notes (Signed)
PCP: Fayette Pho, MD  Subjective:   HPI: Patient is a 25 y.o. male here for f/u of L-SI joint pain with sciatica. Pashto telephone interpreter used throughout entirety of visit.   Patient has been dealing with this pain for about 6 months without relief.  Pain continues over the left SI joint with radicular pain down the posterior leg to the heel.  Feels like a burning pain. Worse with prolonged standing.  Denies any lower extremity weakness.   Of note, he had a lumbar MRI 08/03/2021 which was normal without any evidence of canal stenosis, disc bulge or nerve impingement.  He does take gabapentin occasionally with only mild relief.  He did not tolerate amitriptyline as this made him very groggy.  I did perform an ultrasound-guided SI joint injection back in September which did provide almost complete relief for about 3 days, but then his pain returned.  At last visit, we sent him for a CT-guided SI joint injection, however patient has been unable to get this performed.   BP 102/74   Ht 5\' 8"  (1.727 m)   Wt 145 lb (65.8 kg)   BMI 22.05 kg/m   [facility] 07/20/2021 09/07/2021  Frequency of aerobic exercise (# of days/week) 3 3  Average time in minutes 45 30  Frequency of strengthening activities (# of days/week) 3 2    No flowsheet data found.      Objective:  Physical Exam:  Gen: Well-appearing, in no acute distress; non-toxic CV: Regular Rate. Well-perfused. Warm.  Resp: Breathing unlabored on room air; no wheezing. Psych: Fluid speech in conversation; appropriate affect; normal thought process Neuro: Sensation intact throughout. No gross coordination deficits.  MSK:    -Lumbar spine/SI joint:  -There is no erythema, ecchymosis or swelling noted.  No overlying skin changes.  -There is TTP over the left SI joint just distal to the PSIS.  There is also notable TTP within the mid muscle belly of the left piriformis muscle. No lumbar spinous process  TTP. -Full active range of motion of the lumbar spine and of the left hip without significant pain. -5/5 strength in flexion extension of the spine and of the left lower extremity in the L4-S1 nerve root distributions bilaterally. -Patellar and Achilles tendon reflexes 2/4.  Sensation to light touch intact throughout bilateral lower extremities  -Provocative testing: Negative straight leg raise, + positive FABER test    Assessment & Plan:  1. Left SI-joint pain with posterior LE sciatica-like pain.   His lumbar MRI was completely benign. He did receive almost complete pain relief for about 3 days from U/S-guided SIJ injection months ago, which leads me to believe this is more SIJ dysfunction vs. Sacroilitis. He does report posterior LLE radicular pain, but I feel this is likely more just radiating pain from the SI opposed to true sciatic nerve compression, although this is still possible. Has not received EMG, but this would be reasonable to rule out sciatic-nerve involvement. He was unable to get CT-guided SIJ injection scheduled through PCP and insurance, so at this point we will send him to Dr. 09/09/2021 for further evaluation. I believe if he can get a CT-guided SIJ injection from Dr. Yevette Edwards that provides benefit, the patient would benefit from SI-joint fusion by [facility], although will leave this up to his discretion. He may continue Gabapentin in the meantime, but will discontinue Amitriptyine 2/2 SE's. He may keep his OMM appt with Dr. [facility] upcoming.   Katrinka Blazing, DO  PGY-4, Sports Medicine Fellow [facility]

## 2021-10-05 NOTE — Progress Notes (Signed)
[facility]: Attending Note: I have reviewed the chart, discussed wit the Sports Medicine Fellow. I agree with assessment and treatment plan as detailed in the Fellow's note. Continued problems with sacroiliac joint type pain on the left.  He has an appointment for evaluation for OMM with Dr. Katrinka Blazing in the next 2 weeks and I think he should keep that.  The amitriptyline made him sleepy so we have discontinued that.  He is continuing to use the gabapentin with some benefit.  We will also refer him to Dr. Yevette Edwards for evaluation of possible SI joint surgical intervention.

## 2021-10-05 NOTE — Patient Instructions (Signed)
[facility] Orthoped[facility] Dr Yevette Edwards [address] Monday November 28th at 2p Arrival time is 1:15p [phone]
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
## 2021-10-19 ENCOUNTER — Other Ambulatory Visit: Payer: Self-pay

## 2021-10-19 ENCOUNTER — Ambulatory Visit (INDEPENDENT_AMBULATORY_CARE_PROVIDER_SITE_OTHER): Payer: Medicaid Other | Admitting: Family Medicine

## 2021-10-19 ENCOUNTER — Encounter: Payer: Self-pay | Admitting: Neurology

## 2021-10-19 VITALS — BP 114/70 | HR 76 | Ht 68.0 in | Wt 129.0 lb

## 2021-10-19 DIAGNOSIS — M5430 Sciatica, unspecified side: Secondary | ICD-10-CM | POA: Diagnosis not present

## 2021-10-19 DIAGNOSIS — M9904 Segmental and somatic dysfunction of sacral region: Secondary | ICD-10-CM

## 2021-10-19 DIAGNOSIS — R202 Paresthesia of skin: Secondary | ICD-10-CM

## 2021-10-19 DIAGNOSIS — M533 Sacrococcygeal disorders, not elsewhere classified: Secondary | ICD-10-CM

## 2021-10-19 DIAGNOSIS — M9903 Segmental and somatic dysfunction of lumbar region: Secondary | ICD-10-CM

## 2021-10-19 DIAGNOSIS — M2550 Pain in unspecified joint: Secondary | ICD-10-CM

## 2021-10-19 DIAGNOSIS — M9902 Segmental and somatic dysfunction of thoracic region: Secondary | ICD-10-CM | POA: Diagnosis not present

## 2021-10-19 LAB — COMPREHENSIVE METABOLIC PANEL
ALT: 10 U/L (ref 0–53)
AST: 14 U/L (ref 0–37)
Albumin: 4.3 g/dL (ref 3.5–5.2)
Alkaline Phosphatase: 62 U/L (ref 39–117)
BUN: 11 mg/dL (ref 6–23)
CO2: 30 mEq/L (ref 19–32)
Calcium: 9.5 mg/dL (ref 8.4–10.5)
Chloride: 102 mEq/L (ref 96–112)
Creatinine, Ser: 0.73 mg/dL (ref 0.40–1.50)
GFR: 125.27 mL/min (ref >60.00)
Glucose, Bld: 84 mg/dL (ref 70–99)
Potassium: 4.1 mEq/L (ref 3.5–5.1)
Sodium: 137 mEq/L (ref 135–145)
Total Bilirubin: 0.3 mg/dL (ref 0.2–1.2)
Total Protein: 7.9 g/dL (ref 6.0–8.3)

## 2021-10-19 LAB — CBC WITH DIFFERENTIAL/PLATELET
Basophils Absolute: 0 10*3/uL (ref 0.0–0.1)
Basophils Relative: 0.1 % (ref 0.0–3.0)
Eosinophils Absolute: 0.1 10*3/uL (ref 0.0–0.7)
Eosinophils Relative: 2.1 % (ref 0.0–5.0)
HCT: 45.9 % (ref 39.0–52.0)
Hemoglobin: 15.4 g/dL (ref 13.0–17.0)
Lymphocytes Relative: 45.5 % (ref 12.0–46.0)
Lymphs Abs: 2.7 10*3/uL (ref 0.7–4.0)
MCHC: 33.5 g/dL (ref 30.0–36.0)
MCV: 86.2 fl (ref 78.0–100.0)
Monocytes Absolute: 0.5 10*3/uL (ref 0.1–1.0)
Monocytes Relative: 8 % (ref 3.0–12.0)
Neutro Abs: 2.6 10*3/uL (ref 1.4–7.7)
Neutrophils Relative %: 44.3 % (ref 43.0–77.0)
Platelets: 218 10*3/uL (ref 150.0–400.0)
RBC: 5.32 Mil/uL (ref 4.22–5.81)
RDW: 12.8 % (ref 11.5–15.5)
WBC: 6 10*3/uL (ref 4.0–10.5)

## 2021-10-19 LAB — IBC PANEL
Iron: 93 ug/dL (ref 42–165)
Saturation Ratios: 28.5 % (ref 20.0–50.0)
TIBC: 326.2 ug/dL (ref 250.0–450.0)
Transferrin: 233 mg/dL (ref 212.0–360.0)

## 2021-10-19 LAB — TESTOSTERONE: Testosterone: 306.75 ng/dL (ref 300.00–890.00)

## 2021-10-19 LAB — C-REACTIVE PROTEIN: CRP: <1 mg/dL (ref 0.5–20.0)

## 2021-10-19 LAB — TSH: TSH: 3.34 u[IU]/mL (ref 0.35–5.50)

## 2021-10-19 LAB — SEDIMENTATION RATE: Sed Rate: 10 mm/hr (ref 0–15)

## 2021-10-19 LAB — FERRITIN: Ferritin: 97.6 ng/mL (ref 22.0–322.0)

## 2021-10-19 LAB — VITAMIN D 25 HYDROXY (VIT D DEFICIENCY, FRACTURES): VITD: 7.51 ng/mL — ABNORMAL LOW (ref 30.00–100.00)

## 2021-10-19 LAB — URIC ACID: Uric Acid, Serum: 3 mg/dL — ABNORMAL LOW (ref 4.0–7.8)

## 2021-10-19 NOTE — Assessment & Plan Note (Signed)
Attempted osteopathic manipulation.  Patient did feel somewhat better.  We will see how long this lasts.  Patient's pain does seem to be somewhat out of proportion to the findings we will plan at this time and do feel that a further laboratory work-up could be beneficial.  Also patient is concerned about the radicular symptoms that he is having and we will get a nerve conduction test to see if there is anything potentially going on with the nerve or potentially where we do see the impingement of potentially occurring.  I do think that patient following up with orthopedic surgery is also a good idea to get their recommendations.  Patient can follow-up with me again in 4 to 6 weeks and we will see how patient is responding.

## 2021-10-19 NOTE — Assessment & Plan Note (Signed)
   Decision today to treat with OMT was based on Physical Exam  After verbal consent patient was treated with HVLA, ME, FPR techniques in   rib, lumbar and sacral areas, all areas are chronic   Patient tolerated the procedure well with improvement in symptoms  Patient given exercises, stretches and lifestyle modifications  See medications in patient instructions if given  Patient will follow up in 4-8 weeks

## 2021-10-19 NOTE — Patient Instructions (Addendum)
Labs today Do prescribed exercises at least 3x a week Nerve Conduction Study See you again in 5 weeks

## 2021-10-22 LAB — CALCIUM, IONIZED: Calcium, Ion: 5.1 mg/dL (ref 4.8–5.6)

## 2021-10-22 LAB — CYCLIC CITRUL PEPTIDE ANTIBODY, IGG: Cyclic Citrullin Peptide Ab: <16 UNITS

## 2021-10-22 LAB — RHEUMATOID FACTOR: Rheumatoid fact SerPl-aCnc: <14 IU/mL (ref <14)

## 2021-10-22 LAB — PTH, INTACT AND CALCIUM
Calcium: 9.2 mg/dL (ref 8.6–10.3)
PTH: 47 pg/mL (ref 16–77)

## 2021-10-22 LAB — ANGIOTENSIN CONVERTING ENZYME: Angiotensin-Converting Enzyme: 26 U/L (ref 9–67)

## 2021-10-22 LAB — D-DIMER, QUANTITATIVE: D-Dimer, Quant: <0.19 mcg/mL FEU (ref <0.50)

## 2021-10-22 LAB — ANA: Anti Nuclear Antibody (ANA): NEGATIVE

## 2021-10-22 MED ORDER — VITAMIN D (ERGOCALCIFEROL) 1.25 MG (50000 UNIT) PO CAPS: 50000.0000 [IU] | ORAL_CAPSULE | ORAL | 0 refills | Status: DC

## 2021-10-22 NOTE — Addendum Note (Signed)
Addended by: Doristine Bosworth on: 10/22/2021 04:37 PM   Modules accepted: Orders

## 2021-11-20 ENCOUNTER — Encounter: Payer: Medicaid Other | Admitting: Neurology
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
## 2021-11-28 ENCOUNTER — Encounter: Payer: Self-pay | Admitting: Family Medicine

## 2021-11-28 ENCOUNTER — Other Ambulatory Visit: Payer: Self-pay

## 2021-11-28 ENCOUNTER — Ambulatory Visit: Payer: Medicaid Other | Admitting: Family Medicine

## 2021-11-28 VITALS — BP 118/86 | HR 76 | Ht 68.0 in | Wt 129.0 lb

## 2021-11-28 DIAGNOSIS — E559 Vitamin D deficiency, unspecified: Secondary | ICD-10-CM

## 2021-11-28 DIAGNOSIS — M9901 Segmental and somatic dysfunction of cervical region: Secondary | ICD-10-CM | POA: Diagnosis not present

## 2021-11-28 DIAGNOSIS — M9902 Segmental and somatic dysfunction of thoracic region: Secondary | ICD-10-CM

## 2021-11-28 DIAGNOSIS — M533 Sacrococcygeal disorders, not elsewhere classified: Secondary | ICD-10-CM

## 2021-11-28 DIAGNOSIS — M9904 Segmental and somatic dysfunction of sacral region: Secondary | ICD-10-CM

## 2021-11-28 DIAGNOSIS — M9908 Segmental and somatic dysfunction of rib cage: Secondary | ICD-10-CM

## 2021-11-28 DIAGNOSIS — M9903 Segmental and somatic dysfunction of lumbar region: Secondary | ICD-10-CM

## 2021-11-28 MED ORDER — VITAMIN D (ERGOCALCIFEROL) 1.25 MG (50000 UNIT) PO CAPS: 50000.0000 [IU] | ORAL_CAPSULE | ORAL | 0 refills | Status: DC

## 2021-11-28 NOTE — Patient Instructions (Signed)
Vit D refilled take once weekly Exercises regularly See me in 6-7 weeks

## 2021-11-28 NOTE — Assessment & Plan Note (Signed)
Significantly low vitamin D at 7.5.

## 2021-11-28 NOTE — Assessment & Plan Note (Signed)
Is still believe that this is secondary to more the SI joint pain.  Patient is on the amitriptyline.  I do feel that the low vitamin D could be also potentially contributing.  Started on a once weekly vitamin D.  There was a difficulty with communication over the phone with a translator but patient did not pick up the prescription.  Do believe that patient would make significant improvement though with the vitamin D for muscle endurance.  Patient encouraged to try to do the exercises more religiously.  Follow-up with me again in 6 to 8 weeks
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
## 2022-01-10 ENCOUNTER — Telehealth: Payer: Self-pay

## 2022-01-10 ENCOUNTER — Ambulatory Visit (INDEPENDENT_AMBULATORY_CARE_PROVIDER_SITE_OTHER): Payer: Medicaid Other

## 2022-01-10 ENCOUNTER — Other Ambulatory Visit: Payer: Self-pay

## 2022-01-10 ENCOUNTER — Encounter: Payer: Self-pay | Admitting: Family Medicine

## 2022-01-10 ENCOUNTER — Ambulatory Visit (INDEPENDENT_AMBULATORY_CARE_PROVIDER_SITE_OTHER): Payer: Medicaid Other | Admitting: Family Medicine

## 2022-01-10 VITALS — BP 110/80 | HR 75 | Ht 68.0 in | Wt 129.0 lb

## 2022-01-10 DIAGNOSIS — M25552 Pain in left hip: Secondary | ICD-10-CM | POA: Diagnosis not present

## 2022-01-10 DIAGNOSIS — M533 Sacrococcygeal disorders, not elsewhere classified: Secondary | ICD-10-CM | POA: Diagnosis not present

## 2022-01-10 NOTE — Patient Instructions (Addendum)
5000IU daily of liquid Vit D Xray today [place] [phone] Call Today  When we receive your results we will contact you.

## 2022-01-10 NOTE — Telephone Encounter (Signed)
I called patient to inform him of the DOB mismatch in his chart. Patients DOB is 24-Jan-1996, verified by patient and by identification card. However, On patients insurance card the DOB is listed as [birth year]. Patient is informed of this mismatch and states that his medicaid advisor is also aware of the issue. Patient informed that the MRI is approved but for the DOB of [birth year], but he will need to make sure that his insurance is aware of that. Patient stated understanding. Spoke with patient through [employer].

## 2022-01-10 NOTE — Assessment & Plan Note (Signed)
Patient continues to have pain that is out of proportion to the amount of palpation.  Patient has not taken any medications that have been recommended either at the moment.  Patient states that any of the interventions have not made any significant improvement.  Patient unfortunately feels like this is affecting daily activities, patient states that he is unable to do certain activities.  Patient's pain seems to be more associated still with the piriformis and the sacroiliac joint with radicular symptoms.  Patient was to get a nerve conduction study previously but never got this scheduled and we will try to get it more urgently at this point.  As well as this we will get an MRI of the pelvis with and without contrast with patient failing all other modalities at this point to see if there is any muscle injury, or possible mass sitting on a nerve that could be contributing to the pain.  Patient will follow-up with me again after these and we will discuss further.  Total time with patient secondary to language barrier and social determinants of health including difficulty with his insurance greater than 33 minutes.

## 2022-01-14 ENCOUNTER — Other Ambulatory Visit: Payer: Self-pay

## 2022-01-14 ENCOUNTER — Encounter: Payer: Self-pay | Admitting: Neurology

## 2022-01-14 DIAGNOSIS — R202 Paresthesia of skin: Secondary | ICD-10-CM

## 2022-01-24 ENCOUNTER — Other Ambulatory Visit: Payer: Self-pay

## 2022-01-24 ENCOUNTER — Ambulatory Visit
Admission: RE | Admit: 2022-01-24 | Discharge: 2022-01-24 | Disposition: A | Discharge status: Home / Self Care | Payer: Medicaid Other | Source: Ambulatory Visit | Attending: Family Medicine | Admitting: Family Medicine

## 2022-01-24 DIAGNOSIS — M25552 Pain in left hip: Secondary | ICD-10-CM

## 2022-01-24 MED ORDER — GADOBENATE DIMEGLUMINE 529 MG/ML IV SOLN
12.0000 mL | Freq: Once | INTRAVENOUS | Status: AC | PRN
  Administered 2022-01-24: 12 mL via INTRAVENOUS

## 2022-01-29 ENCOUNTER — Other Ambulatory Visit: Payer: Self-pay

## 2022-01-29 DIAGNOSIS — M461 Sacroiliitis, not elsewhere classified: Secondary | ICD-10-CM

## 2022-01-29 NOTE — Progress Notes (Signed)
Spoke with patient. He would like to get injection and referral to rheumatology. Both placed.  ?

## 2022-01-30 ENCOUNTER — Other Ambulatory Visit: Payer: Self-pay | Admitting: General Surgery

## 2022-02-12 ENCOUNTER — Other Ambulatory Visit: Payer: Self-pay

## 2022-02-12 ENCOUNTER — Ambulatory Visit: Payer: Medicaid Other | Admitting: Neurology

## 2022-02-12 DIAGNOSIS — R202 Paresthesia of skin: Secondary | ICD-10-CM

## 2022-02-12 NOTE — Procedures (Signed)
[facility]  ?[address] ? [place], [place] [address] ?Tel: [phone] ?Fax:  [phone] ?Test Date:  02/12/2022 ? ?Patient: Jeremy [name] DOB: 01/29/1996 Physician: Nita Sickle, DO  ?Sex: Male Height: 5\' 8"  Ref Phys: , DO  ?ID#: Antoine Primas   Technician:   ? ?Patient Complaints: ?This is a 26 year old man referred for evaluation of left leg pain. ? ?NCV & EMG Findings: ?Extensive electrophysiologic testing of the left lower extremity shows: ?Left sural and superficial peroneal sensory responses are within normal limits. ?Left peroneal and tibial motor responses are within normal limits. ?Left tibial H reflex study is within normal limits. ?There is no evidence of active or chronic motor axonal loss changes affecting any of the tested muscles.  Motor unit configuration and recruitment pattern is within normal limits. ? ?impression: ?This is a normal study of the left lower extremity.  In particular, there is no evidence of a lumbosacral radiculopathy or sensorimotor polyneuropathy. ? ? ?___________________________ ?34, DO ? ? ? ?Nerve Conduction Studies ?Anti Sensory Summary Table ? ? Stim Site NR Peak (ms) Norm Peak (ms) P-T Amp (?V) Norm P-T Amp  ?Left Sup Peroneal Anti Sensory (Ant Lat Mall)  32?C  ?12 cm    2.7 <4.4 24.1 >6  ?Left Sural Anti Sensory (Lat Mall)  32?C  ?Calf    3.1 <4.4 32.4 >6  ? ?Motor Summary Table ? ? Stim Site NR Onset (ms) Norm Onset (ms) O-P Amp (mV) Norm O-P Amp Site1 Site2 Delta-0 (ms) Dist (cm) Vel (m/s) Norm Vel (m/s)  ?Left Peroneal Motor (Ext Dig Brev)  32?C  ?Ankle    3.4 <5.5 6.9 >3 B Fib Ankle 7.1 37.0 52 >41  ?B Fib    10.5  6.9  Poplt B Fib 1.2 8.0 67 >41  ?Poplt    11.7  6.4         ?Left Tibial Motor (Abd Nita Sickle Brev)  32?C  ?Ankle    3.7 <5.8 13.3 >8 Knee Ankle 7.6 41.0 54 >41  ?Knee    11.3  11.8         ? ?H Reflex Studies ? ? NR H-Lat (ms) Lat Norm (ms) L-R H-Lat (ms)  ?Left Tibial (Gastroc)  32?C  ?   25.58 <35    ? ?EMG ? ? Side Muscle Ins Act Fibs Psw Fasc Number Recrt Dur Dur. Amp Amp. Poly Poly. Comment  ?Left AntTibialis Nml Nml Nml Nml Nml Nml Nml Nml Nml Nml Nml Nml N/A  ?Left Gastroc Nml Nml Nml Nml Nml Nml Nml Nml Nml Nml Nml Nml N/A  ?Left Flex Dig Long Nml Nml Nml Nml Nml Nml Nml Nml Nml Nml Nml Nml N/A  ?Left RectFemoris Nml Nml Nml Nml Nml Nml Nml Nml Nml Nml Nml Nml N/A  ?Left GluteusMed Nml Nml Nml Nml Nml Nml Nml Nml Nml Nml Nml Nml N/A  ?Left BicepsFemS Nml Nml Nml Nml Nml Nml Nml Nml Nml Nml Nml Nml N/A  ? ? ? ? ?Waveforms: ?    ? ?   ? ? ?

## 2022-02-14 ENCOUNTER — Ambulatory Visit
Admission: RE | Admit: 2022-02-14 | Discharge: 2022-02-14 | Disposition: A | Discharge status: Home / Self Care | Payer: Medicaid Other | Source: Ambulatory Visit | Attending: Family Medicine | Admitting: Family Medicine

## 2022-02-14 DIAGNOSIS — M461 Sacroiliitis, not elsewhere classified: Secondary | ICD-10-CM

## 2022-02-14 MED ORDER — IOPAMIDOL (ISOVUE-M 200) INJECTION 41%
1.0000 mL | Freq: Once | INTRAMUSCULAR | Status: AC
  Administered 2022-02-14: 1 mL via INTRA_ARTICULAR

## 2022-02-14 MED ORDER — METHYLPREDNISOLONE ACETATE 40 MG/ML INJ SUSP (RADIOLOG
80.0000 mg | Freq: Once | INTRAMUSCULAR | Status: AC
  Administered 2022-02-14: 80 mg via INTRA_ARTICULAR
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
## 2022-02-26 ENCOUNTER — Other Ambulatory Visit: Payer: Self-pay | Admitting: Family Medicine

## 2022-02-26 ENCOUNTER — Ambulatory Visit (INDEPENDENT_AMBULATORY_CARE_PROVIDER_SITE_OTHER): Payer: Medicaid Other | Admitting: Family Medicine

## 2022-02-26 VITALS — BP 110/70 | HR 55 | Ht 68.0 in | Wt 129.0 lb

## 2022-02-26 DIAGNOSIS — M533 Sacrococcygeal disorders, not elsewhere classified: Secondary | ICD-10-CM

## 2022-02-26 DIAGNOSIS — G8929 Other chronic pain: Secondary | ICD-10-CM | POA: Diagnosis not present

## 2022-02-26 MED ORDER — METHYLPREDNISOLONE ACETATE 80 MG/ML IJ SUSP
80.0000 mg | Freq: Once | INTRAMUSCULAR | Status: AC
  Administered 2022-02-26: 80 mg via INTRAMUSCULAR

## 2022-02-26 MED ORDER — KETOROLAC TROMETHAMINE 60 MG/2ML IM SOLN
60.0000 mg | Freq: Once | INTRAMUSCULAR | Status: AC
  Administered 2022-02-26: 60 mg via INTRAMUSCULAR

## 2022-02-26 NOTE — Patient Instructions (Signed)
S1 [id number] ?Injections in backside today ?See me again 6 weeks after RFA ?

## 2022-02-26 NOTE — Assessment & Plan Note (Signed)
Patient did respond well to the sacroiliac joint injection.  I discussed with patient that we may want to consider a possible medial branch block on the L5-S1 area to see if this could potentially be beneficial.  Will refer patient to see if we can help with this.  A1c have patient responds.  Patient given a Toradol and Depo-Medrol injection as well.  All questions were answered today through the aid of an interpreter.  Spent greater than 35 minutes with them discussing different treatment options, plan and will follow-up. ?

## 2022-03-19 ENCOUNTER — Other Ambulatory Visit: Payer: Self-pay | Admitting: *Deleted

## 2022-03-19 DIAGNOSIS — M5416 Radiculopathy, lumbar region: Secondary | ICD-10-CM

## 2022-03-19 DIAGNOSIS — G8929 Other chronic pain: Secondary | ICD-10-CM

## 2022-03-19 NOTE — Addendum Note (Signed)
Addended by: Edwena Felty T on: 03/19/2022 03:13 PM ? ? Modules accepted: Orders ? ?

## 2022-03-20 ENCOUNTER — Ambulatory Visit
Admission: RE | Admit: 2022-03-20 | Discharge: 2022-03-20 | Disposition: A | Discharge status: Home / Self Care | Payer: Medicaid Other | Source: Ambulatory Visit | Attending: Family Medicine | Admitting: Family Medicine

## 2022-03-20 DIAGNOSIS — G8929 Other chronic pain: Secondary | ICD-10-CM

## 2022-03-20 NOTE — Discharge Instructions (Signed)

## 2022-03-26 ENCOUNTER — Other Ambulatory Visit: Payer: Self-pay | Admitting: Family Medicine

## 2022-03-26 DIAGNOSIS — G8929 Other chronic pain: Secondary | ICD-10-CM

## 2022-04-29 ENCOUNTER — Other Ambulatory Visit: Payer: Self-pay | Admitting: Orthopedic Surgery

## 2022-04-29 DIAGNOSIS — M545 Low back pain, unspecified: Secondary | ICD-10-CM

## 2022-04-30 ENCOUNTER — Other Ambulatory Visit: Payer: Self-pay | Admitting: Orthopedic Surgery

## 2022-04-30 DIAGNOSIS — G8929 Other chronic pain: Secondary | ICD-10-CM

## 2022-05-08 ENCOUNTER — Ambulatory Visit
Admission: RE | Admit: 2022-05-08 | Discharge: 2022-05-08 | Disposition: A | Discharge status: Home / Self Care | Payer: Medicaid Other | Source: Ambulatory Visit | Attending: Orthopedic Surgery | Admitting: Orthopedic Surgery

## 2022-05-08 DIAGNOSIS — M545 Low back pain, unspecified: Secondary | ICD-10-CM

## 2022-05-29 ENCOUNTER — Ambulatory Visit
Admission: RE | Admit: 2022-05-29 | Discharge: 2022-05-29 | Disposition: A | Discharge status: Home / Self Care | Payer: Medicaid Other | Source: Ambulatory Visit | Attending: Orthopedic Surgery | Admitting: Orthopedic Surgery

## 2022-05-29 DIAGNOSIS — G8929 Other chronic pain: Secondary | ICD-10-CM

## 2022-06-04 IMAGING — DX DG HIP (WITH OR WITHOUT PELVIS) 2-3V*L*
3 series · 4 of 4 positions shown · non-contrast
Comparison: None.

CLINICAL DATA: Left hip pain.

EXAM:
DG HIP (WITH OR WITHOUT PELVIS) 2-3V LEFT

[pelvis ap]
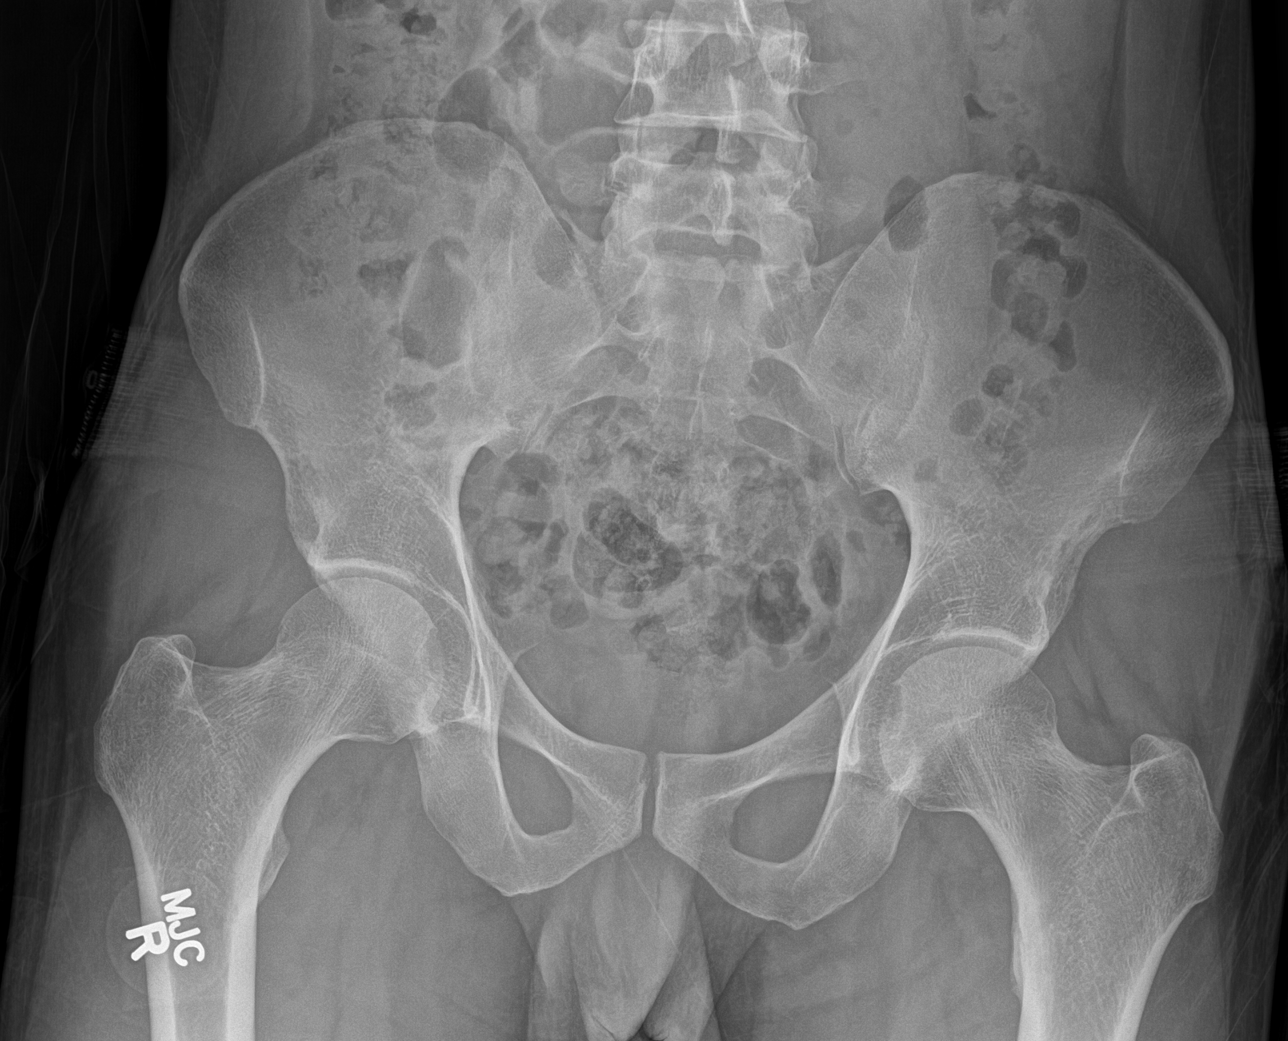

[hip ap]
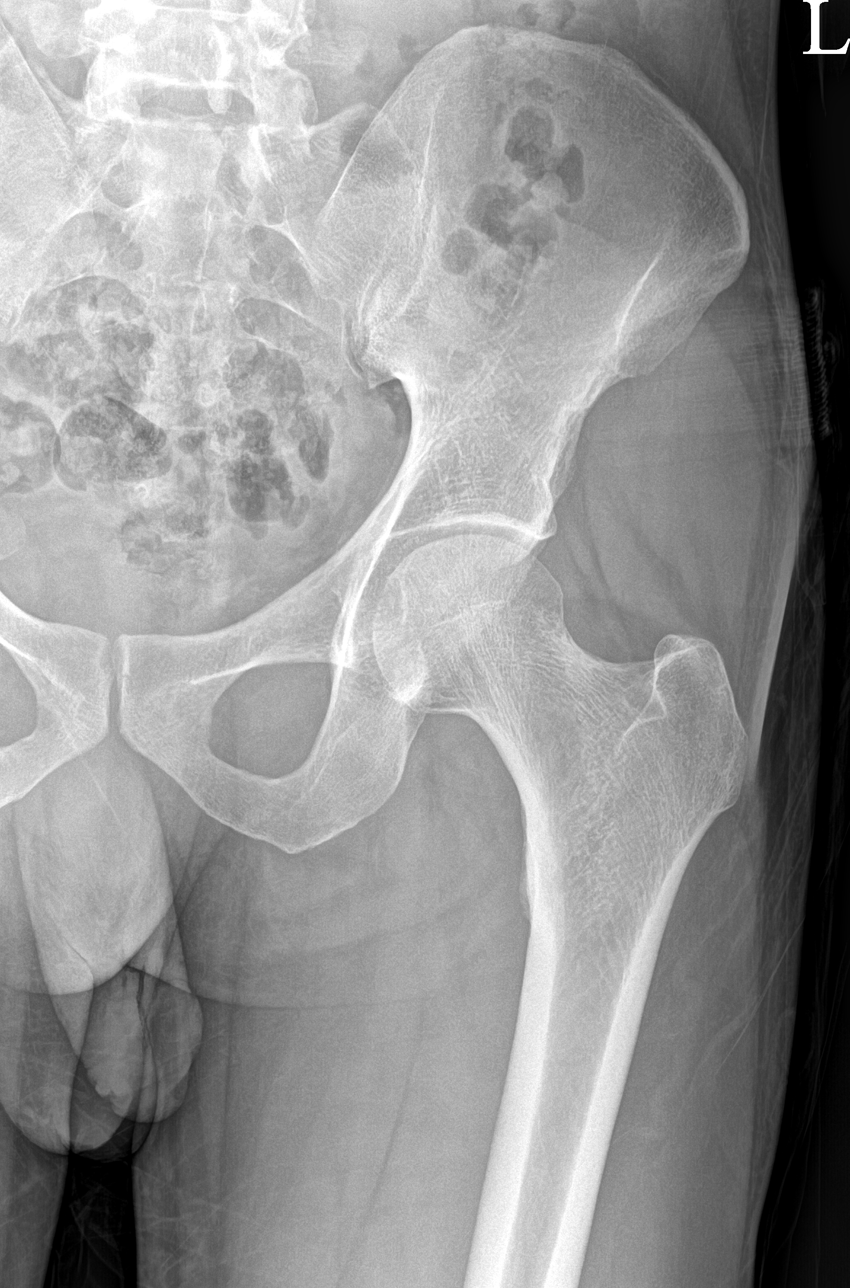

[Series 3: hip frog leg · 0.14mm/px · 2 of 2 slices shown]
[im 1/2]
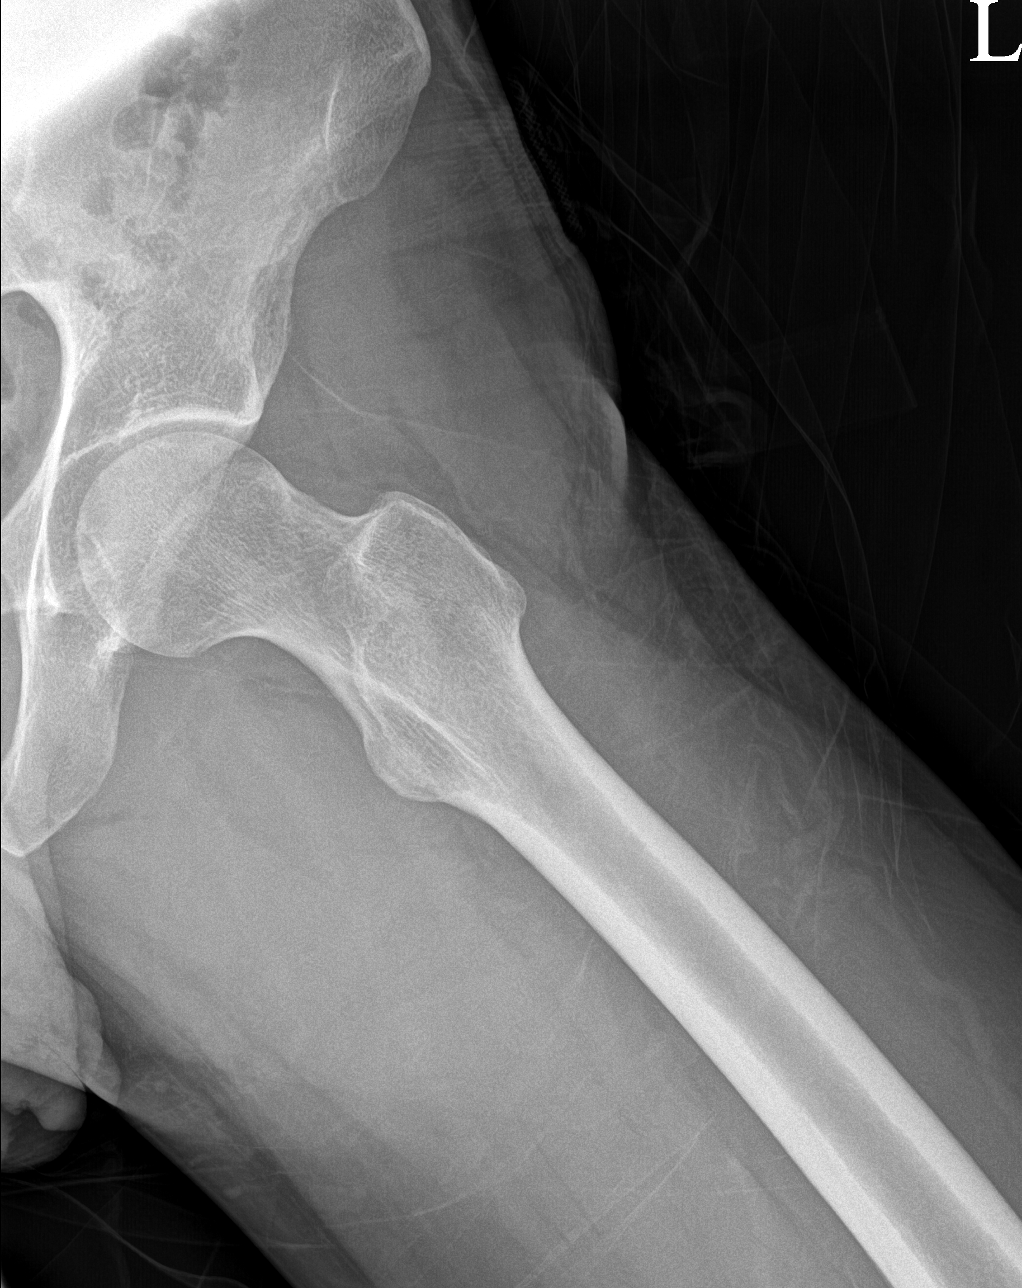
[im 2/2]
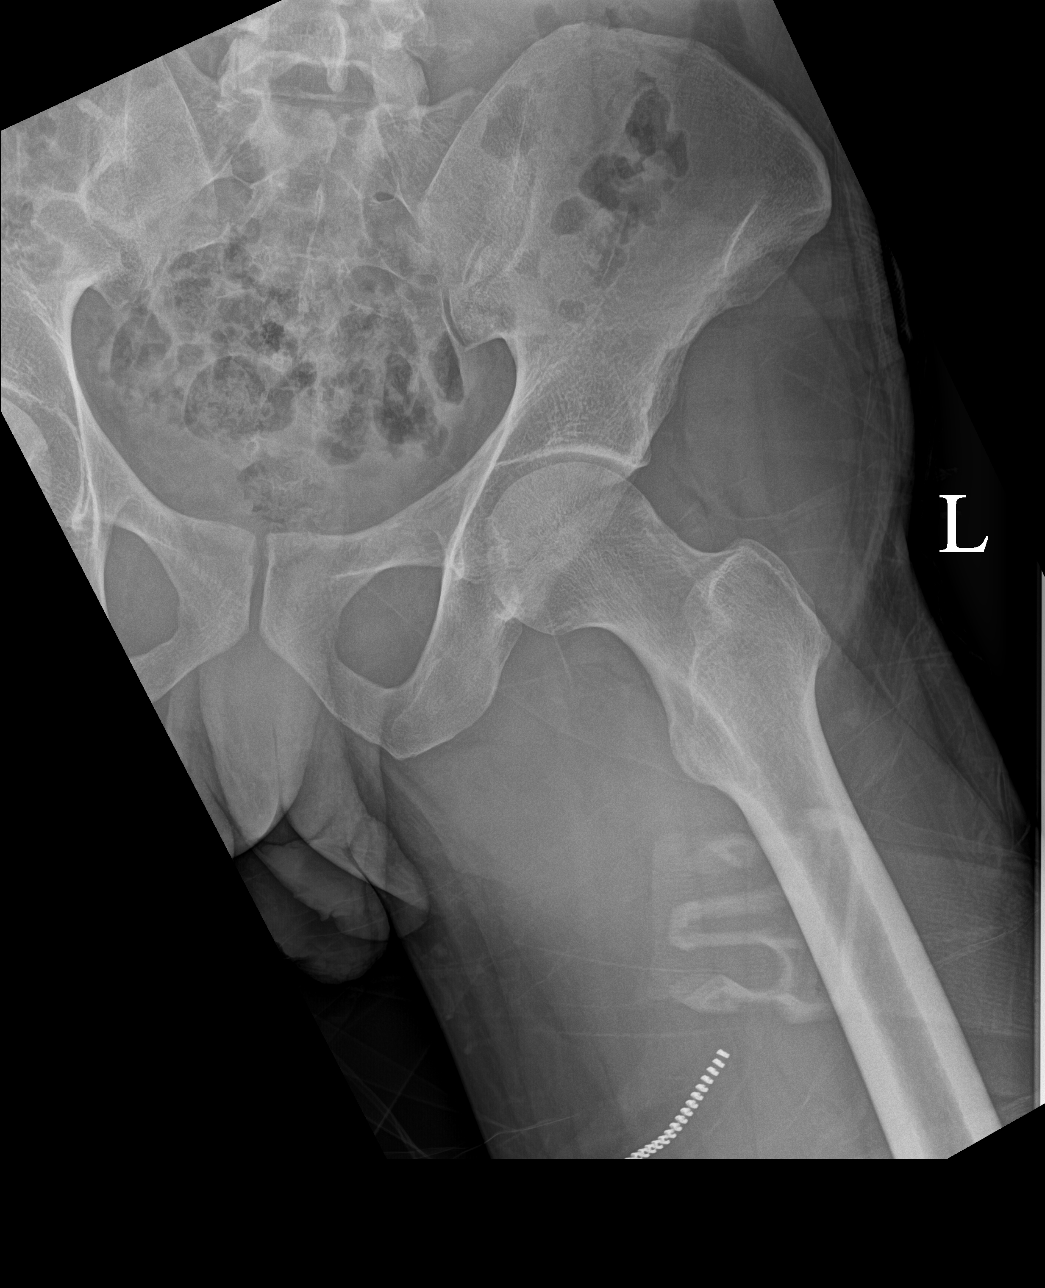

[4 of 4 positions shown; findings below may reference images not displayed]

FINDINGS: Normal bone mineralization. The bilateral sacroiliac pubic symphysis
and bilateral femoroacetabular joint spaces are maintained. No acute
fracture or dislocation. Normal morphology of the femoral head-neck
junction without CAM-type bump deformity.
IMPRESSION: :
IMPRESSION: Normal pelvis and left hip radiographs.
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
## 2022-08-29 ENCOUNTER — Ambulatory Visit: Payer: Medicaid Other | Admitting: Family Medicine

## 2022-08-29 ENCOUNTER — Encounter: Payer: Self-pay | Admitting: Family Medicine

## 2022-08-29 VITALS — BP 106/69 | HR 62 | Ht 68.0 in | Wt 130.0 lb

## 2022-08-29 DIAGNOSIS — M546 Pain in thoracic spine: Secondary | ICD-10-CM

## 2022-08-29 DIAGNOSIS — H60393 Other infective otitis externa, bilateral: Secondary | ICD-10-CM | POA: Diagnosis not present

## 2022-08-29 DIAGNOSIS — M533 Sacrococcygeal disorders, not elsewhere classified: Secondary | ICD-10-CM

## 2022-08-29 DIAGNOSIS — K219 Gastro-esophageal reflux disease without esophagitis: Secondary | ICD-10-CM

## 2022-08-29 DIAGNOSIS — M5430 Sciatica, unspecified side: Secondary | ICD-10-CM | POA: Diagnosis present

## 2022-08-29 DIAGNOSIS — G8929 Other chronic pain: Secondary | ICD-10-CM

## 2022-08-29 MED ORDER — FAMOTIDINE 20 MG PO TABS: 20.0000 mg | ORAL_TABLET | Freq: Every day | ORAL | 1 refills | Status: DC

## 2022-08-29 MED ORDER — MELOXICAM 7.5 MG PO TABS: 7.5000 mg | ORAL_TABLET | Freq: Every day | ORAL | 0 refills | Status: DC

## 2022-08-29 NOTE — Assessment & Plan Note (Addendum)
Chronic, intermittent.  Start meloxicam daily x2 weeks then daily as needed afterwards.  Follow-up in 2 to 3 weeks to assess.  Discussed stopping ibuprofen when he takes meloxicam.  Tylenol okay.  Patient voices understanding.  Patient also amenable to CBT therapy through some of our refugee counseling resources.  I will send in the referral to [facility].

## 2022-08-29 NOTE — Progress Notes (Signed)
SUBJECTIVE:   CHIEF COMPLAINT / HPI:   In person Pashto interpreter utilized for the entirety of the visit today.   Back and leg pain Jeremy [name] presents today for ongoing back and leg pain.  Previously diagnosed with piriformis syndrome, SI joint dysfunction, and lumbar radiculopathy.  In the past, he is seen physical therapy, sports med, and most recently neurology.  He has active referral to rheumatology and orthopedic surgery, however these have not yet been completed.  Today he reports frustration with his ongoing symptoms despite multiple physicians and interventions.  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  He reports ongoing intermittent low back pain and shooting burning pain down his leg to the bottom of his feet.  He cannot walk more than 20 minutes without this pain.   Medication list reviewed, he is not taking any prescription medications at this time.  He does not believe he ever received the amitriptyline from the pharmacy.  He is only taking over-the-counter Tylenol ibuprofen.  These do provide mild to moderate leaf but then wear off.  He also reports burning esophageal pain with pills, he believes he has acid reflux.  No medications for that.  He reports he does not really want to go to rheumatology if it is not going to help.  He does not want to keep going to doctors or using his Medicaid if there is not can be long-lasting relief.  He comes to me today because I am asaley medicine doctor and he believes that I can help him navigate the specialists and chart an appropriate next course of action.  PERTINENT  PMH / PSH: Sacral somatic dysfunction of spine, SI joint dysfunction, piriformis syndrome, vit D deficiency   OBJECTIVE:   BP 106/69   Pulse 62   Ht 5\' 8"  (1.727 m)   Wt 130 lb (59 kg)   SpO2 98%   BMI 19.77 kg/m    PHQ-9:     08/29/2022    9:52 AM 07/18/2021    9:35 AM 04/20/2021   10:14  AM  Depression screen PHQ 2/9  Decreased Interest 0 0 0  Down, Depressed, Hopeless 0 0 0  PHQ - 2 Score 0 0 0  Altered sleeping 0 0 0  Tired, decreased energy 0 0 0  Change in appetite 0 0 0  Feeling bad or failure about yourself  0 0 0  Trouble concentrating 0 0 0  Moving slowly or fidgety/restless 0 0 0  Suicidal thoughts 0 0 0  PHQ-9 Score 0 0 0  Difficult doing work/chores  Not difficult at all     Physical Exam General: Awake, alert, oriented, no acute distress Respiratory: Unlabored respirations, speaking in full sentences, no respiratory distress Extremities: Moving all extremities spontaneously  ASSESSMENT/PLAN:   Gastroesophageal reflux disease Acid reflux, discussed starting famotidine.  Patient amenable. Did have previous positive H. pylori breath test 11/21/2020, but completed quadruple therapy.  Back pain Chronic, intermittent.  Start meloxicam daily x2 weeks then daily as needed afterwards.  Follow-up in 2 to 3 weeks to assess.  Discussed stopping ibuprofen when he takes meloxicam.  Tylenol okay.  Patient voices understanding.  Patient also amenable to CBT therapy through some of our refugee counseling resources.  I will send in the referral to [facility].     Ezequiel Essex, MD [place]

## 2022-08-29 NOTE — Patient Instructions (Addendum)
I have translated the following text using Google translate.  As such, there are many errors.  I apologize for the poor written translation; however, we do not have written  translation services yet. It was wonderful to see you today. Thank you for allowing me to be a part of your care. Below is a short summary of what we discussed at your visit today: ?? ????? ??? ? ???? ????? ?? ?????? ??? ?????? ??. ?? ?? ????? ???? ???? ???? ???. ?? ? ???? ???? ??? ????? ????? ????? ?????? ?? ??????? ??? ?? ???? ? ????? ????? ??????? ?? ???. ?? ?? ??? ???? ?? ?? ?? ??? ???? ?????. ???? ?? ????? ????? ?? ????? ? ??????? ???? ??. ????? ? ??? ?? ????? ?? ?? ??? ?? ????? ?? ????? ?? ??? ???:  Leg pain Start taking meloxicam once daily for two weeks. This is an anti-inflammatory medicine. If you take this pill, do NOT take ibuprofen.  Come back in 2 to 3 weeks to see if this medicine is working for you.  I wrote a letter to your employer that you are able to take sitting breaks once per hour.  ? ??? ??? ? ??? ????? ????? ?? ??? ?? ?? ?? ????????? ?????? ??? ???. ?? ? ?????? ?? ???? ??. ?? ???? ?? ???? ?????? ibuprofen ?? ????. ? 2 ??? ?? 3 ????? ?? ????? ???? ???? ????? ?? ?? ???? ????? ????? ??? ???. ?? ????? ???????? ?? ?? ??? ????? ?? ???? ???? ?? ?? ???? ?? ?? ?? ? ????? ???? ?????.  Rheumatology referral I think it is a good idea to go to the rheumatologist once and see what they have to say about your pain. I will send them your information. The information for their clinic is below.  Dr. Bo Merino  [address], [place], [place] [address] [phone] ? ?????????? ????? ??? ?? ??? ?? ?? ??? ?? ?? ?? ?? ? ???????????? ?? ??? ?? ?? ????? ?? ??? ????? ? ??? ?? ??? ?? ????. ?? ?? ??? ?? ????? ??????? ?????. ? ??? ? ?????? ????? ??????? ?? ????? ??? ??. ????? ???? ??????? [id number] ?????????? [place] [phone] (???) ???-????  Acid reflux  I have sent you a medicine to reduce acid. It is a  once daily medication called famotidine.  ???? ?????? ?? ???? ?? ? ????? ????? ????? ???? ????? ??. ?? ?? ??? ?? ?? ?? ???? ?? ?? ?????????? ??????.   Please bring all of your medications to every appointment! ??????? ???? ?? ?????? ?? ??? ??? ???? ?????!  If you have any questions or concerns, please do not hesitate to contact us via phone or MyChart message.   Ezequiel Essex, MD

## 2022-08-29 NOTE — Assessment & Plan Note (Signed)
Acid reflux, discussed starting famotidine.  Patient amenable. Did have previous positive H. pylori breath test 11/21/2020, but completed quadruple therapy.

## 2022-08-30 ENCOUNTER — Other Ambulatory Visit: Payer: Self-pay | Admitting: Family Medicine

## 2022-08-30 ENCOUNTER — Ambulatory Visit: Payer: Medicaid Other | Attending: Rheumatology | Admitting: Rheumatology

## 2022-08-30 ENCOUNTER — Encounter: Payer: Self-pay | Admitting: Rheumatology

## 2022-08-30 VITALS — BP 117/75 | HR 58 | Resp 14 | Ht 67.0 in | Wt 128.0 lb

## 2022-08-30 DIAGNOSIS — M9904 Segmental and somatic dysfunction of sacral region: Secondary | ICD-10-CM

## 2022-08-30 DIAGNOSIS — M7062 Trochanteric bursitis, left hip: Secondary | ICD-10-CM | POA: Diagnosis not present

## 2022-08-30 DIAGNOSIS — E559 Vitamin D deficiency, unspecified: Secondary | ICD-10-CM

## 2022-08-30 DIAGNOSIS — Z789 Other specified health status: Secondary | ICD-10-CM

## 2022-08-30 DIAGNOSIS — M5430 Sciatica, unspecified side: Secondary | ICD-10-CM

## 2022-08-30 DIAGNOSIS — M5442 Lumbago with sciatica, left side: Secondary | ICD-10-CM | POA: Diagnosis not present

## 2022-08-30 DIAGNOSIS — M533 Sacrococcygeal disorders, not elsewhere classified: Secondary | ICD-10-CM

## 2022-08-30 DIAGNOSIS — G5702 Lesion of sciatic nerve, left lower limb: Secondary | ICD-10-CM

## 2022-08-30 DIAGNOSIS — M461 Sacroiliitis, not elsewhere classified: Secondary | ICD-10-CM | POA: Diagnosis not present

## 2022-08-30 DIAGNOSIS — Z603 Acculturation difficulty: Secondary | ICD-10-CM

## 2022-08-30 DIAGNOSIS — G8929 Other chronic pain: Secondary | ICD-10-CM

## 2022-08-30 NOTE — Patient Instructions (Signed)
Back Exercises The following exercises strengthen the muscles that help to support the trunk (torso) and back. They also help to keep the lower back flexible. Doing these exercises can help to prevent or lessen existing low back pain. If you have back pain or discomfort, try doing these exercises 2-3 times each day or as told by your health care provider. As your pain improves, do them once each day, but increase the number of times that you repeat the steps for each exercise (do more repetitions). To prevent the recurrence of back pain, continue to do these exercises once each day or as told by your health care provider. Do exercises exactly as told by your health care provider and adjust them as directed. It is normal to feel mild stretching, pulling, tightness, or discomfort as you do these exercises, but you should stop right away if you feel sudden pain or your pain gets worse. Exercises Single knee to chest Repeat these steps 3-5 times for each leg: Lie on your back on a firm bed or the floor with your legs extended. Bring one knee to your chest. Your other leg should stay extended and in contact with the floor. Hold your knee in place by grabbing your knee or thigh with both hands and hold. Pull on your knee until you feel a gentle stretch in your lower back or buttocks. Hold the stretch for 10-30 seconds. Slowly release and straighten your leg.  Pelvic tilt Repeat these steps 5-10 times: Lie on your back on a firm bed or the floor with your legs extended. Bend your knees so they are pointing toward the ceiling and your feet are flat on the floor. Tighten your lower abdominal muscles to press your lower back against the floor. This motion will tilt your pelvis so your tailbone points up toward the ceiling instead of pointing to your feet or the floor. With gentle tension and even breathing, hold this position for 5-10 seconds.  Cat-cow Repeat these steps until your lower back becomes  more flexible: Get into a hands-and-knees position on a firm bed or the floor. Keep your hands under your shoulders, and keep your knees under your hips. You may place padding under your knees for comfort. Let your head hang down toward your chest. Contract your abdominal muscles and point your tailbone toward the floor so your lower back becomes rounded like the back of a cat. Hold this position for 5 seconds. Slowly lift your head, let your abdominal muscles relax, and point your tailbone up toward the ceiling so your back forms a sagging arch like the back of a cow. Hold this position for 5 seconds.  Press-ups Repeat these steps 5-10 times: Lie on your abdomen (face-down) on a firm bed or the floor. Place your palms near your head, about shoulder-width apart. Keeping your back as relaxed as possible and keeping your hips on the floor, slowly straighten your arms to raise the top half of your body and lift your shoulders. Do not use your back muscles to raise your upper torso. You may adjust the placement of your hands to make yourself more comfortable. Hold this position for 5 seconds while you keep your back relaxed. Slowly return to lying flat on the floor.  Bridges Repeat these steps 10 times: Lie on your back on a firm bed or the floor. Bend your knees so they are pointing toward the ceiling and your feet are flat on the floor. Your arms should be flat   at your sides, next to your body. Tighten your buttocks muscles and lift your buttocks off the floor until your waist is at almost the same height as your knees. You should feel the muscles working in your buttocks and the back of your thighs. If you do not feel these muscles, slide your feet 1-2 inches (2.5-5 cm) farther away from your buttocks. Hold this position for 3-5 seconds. Slowly lower your hips to the starting position, and allow your buttocks muscles to relax completely. If this exercise is too easy, try doing it with your arms  crossed over your chest. Abdominal crunches Repeat these steps 5-10 times: Lie on your back on a firm bed or the floor with your legs extended. Bend your knees so they are pointing toward the ceiling and your feet are flat on the floor. Cross your arms over your chest. Tip your chin slightly toward your chest without bending your neck. Tighten your abdominal muscles and slowly raise your torso high enough to lift your shoulder blades a tiny bit off the floor. Avoid raising your torso higher than that because it can put too much stress on your lower back and does not help to strengthen your abdominal muscles. Slowly return to your starting position.  Back lifts Repeat these steps 5-10 times: Lie on your abdomen (face-down) with your arms at your sides, and rest your forehead on the floor. Tighten the muscles in your legs and your buttocks. Slowly lift your chest off the floor while you keep your hips pressed to the floor. Keep the back of your head in line with the curve in your back. Your eyes should be looking at the floor. Hold this position for 3-5 seconds. Slowly return to your starting position.  Contact a health care provider if: Your back pain or discomfort gets much worse when you do an exercise. Your worsening back pain or discomfort does not lessen within 2 hours after you exercise. If you have any of these problems, stop doing these exercises right away. Do not do them again unless your health care provider says that you can. Get help right away if: You develop sudden, severe back pain. If this happens, stop doing the exercises right away. Do not do them again unless your health care provider says that you can. This information is not intended to replace advice given to you by your health care provider. Make sure you discuss any questions you have with your health care provider. Document Revised: 05/01/2021 Document Reviewed: 01/17/2021 Elsevier Patient Education  2023 Elsevier  In  Iliotibial Band Syndrome Rehab Ask your health care provider which exercises are safe for you. Do exercises exactly as told by your health care provider and adjust them as directed. It is normal to feel mild stretching, pulling, tightness, or discomfort as you do these exercises. Stop right away if you feel sudden pain or your pain gets significantly worse. Do not begin these exercises until told by your health care provider. Stretching and range-of-motion exercises These exercises warm up your muscles and joints and improve the movement and flexibility of your hip and pelvis. Quadriceps stretch, prone  Lie on your abdomen (prone position) on a firm surface, such as a bed or padded floor. Bend your left / right knee and reach back to hold your ankle or pant leg. If you cannot reach your ankle or pant leg, loop a belt around your foot and grab the belt instead. Gently pull your heel toward your buttocks. Your  knee should not slide out to the side. You should feel a stretch in the front of your thigh and knee (quadriceps). Hold this position for __________ seconds. Repeat __________ times. Complete this exercise __________ times a day. Iliotibial band stretch An iliotibial band is a strong band of muscle tissue that runs from the outer side of your hip to the outer side of your thigh and knee. Lie on your side with your left / right leg in the top position. Bend both of your knees and grab your left / right ankle. Stretch out your bottom arm to help you balance. Slowly bring your top knee back so your thigh goes behind your trunk. Slowly lower your top leg toward the floor until you feel a gentle stretch on the outside of your left / right hip and thigh. If you do not feel a stretch and your knee will not fall farther, place the heel of your other foot on top of your knee and pull your knee down toward the floor with your foot. Hold this position for __________ seconds. Repeat __________ times.  Complete this exercise __________ times a day. Strengthening exercises These exercises build strength and endurance in your hip and pelvis. Endurance is the ability to use your muscles for a long time, even after they get tired. Straight leg raises, side-lying This exercise strengthens the muscles that rotate the leg at the hip and move it away from your body (hip abductors). Lie on your side with your left / right leg in the top position. Lie so your head, shoulder, hip, and knee line up. You may bend your bottom knee to help you balance. Roll your hips slightly forward so your hips are stacked directly over each other and your left / right knee is facing forward. Tense the muscles in your outer thigh and lift your top leg 4-6 inches (10-15 cm). Hold this position for __________ seconds. Slowly lower your leg to return to the starting position. Let your muscles relax completely before doing another repetition. Repeat __________ times. Complete this exercise __________ times a day. Leg raises, prone This exercise strengthens the muscles that move the hips backward (hip extensors). Lie on your abdomen (prone position) on your bed or a firm surface. You can put a pillow under your hips if that is more comfortable for your lower back. Bend your left / right knee so your foot is straight up in the air. Squeeze your buttocks muscles and lift your left / right thigh off the bed. Do not let your back arch. Tense your thigh muscle as hard as you can without increasing any knee pain. Hold this position for __________ seconds. Slowly lower your leg to return to the starting position and allow it to relax completely. Repeat __________ times. Complete this exercise __________ times a day. Hip hike Stand sideways on a bottom step. Stand on your left / right leg with your other foot unsupported next to the step. You can hold on to a railing or wall for balance if needed. Keep your knees straight and your  torso square. Then lift your left / right hip up toward the ceiling. Slowly let your left / right hip lower toward the floor, past the starting position. Your foot should get closer to the floor. Do not lean or bend your knees. Repeat __________ times. Complete this exercise __________ times a day. This information is not intended to replace advice given to you by your health care provider. Make sure  you discuss any questions you have with your health care provider. Document Revised: 01/12/2020 Document Reviewed: 01/12/2020 Elsevier Patient Education  [place].

## 2022-09-01 LAB — HLA-B27 ANTIGEN: HLA-B27 Antigen: NEGATIVE

## 2022-09-01 NOTE — Progress Notes (Signed)
HLA B-27 negative. I will discuss results at the follow up visit.

## 2022-09-10 ENCOUNTER — Ambulatory Visit: Payer: Medicaid Other | Admitting: Family Medicine

## 2022-09-10 VITALS — BP 102/64 | HR 63 | Ht 68.0 in | Wt 131.2 lb

## 2022-09-10 DIAGNOSIS — M5430 Sciatica, unspecified side: Secondary | ICD-10-CM

## 2022-09-10 DIAGNOSIS — M533 Sacrococcygeal disorders, not elsewhere classified: Secondary | ICD-10-CM

## 2022-09-10 DIAGNOSIS — Z23 Encounter for immunization: Secondary | ICD-10-CM | POA: Diagnosis not present

## 2022-09-10 DIAGNOSIS — M9904 Segmental and somatic dysfunction of sacral region: Secondary | ICD-10-CM

## 2022-09-10 MED ORDER — MELOXICAM 15 MG PO TABS: 15.0000 mg | ORAL_TABLET | Freq: Every day | ORAL | 0 refills | Status: DC
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
## 2022-09-10 NOTE — Patient Instructions (Addendum)
I have translated the following text using Google translate.  As such, there are many errors.  I apologize for the poor written translation; however, we do not have written  translation services yet. Below is a short summary of what we discussed at your visit today: ?? ????? ??? ? ???? ????? ?? ?????? ??? ?????? ??. ?? ?? ????? ???? ???? ???? ???. ?? ? ???? ???? ??? ????? ????? ????? ?????? ?? ??????? ??? ?? ???? ? ????? ????? ??????? ?? ???. ????? ? ??? ?? ????? ?? ?? ??? ?? ????? ?? ????? ?? ??? ???:  Leg and back pain Continue taking the meloxicam medication every day as prescribed. Continue the meloxicam for a total of 3 months. Do NOT take ibuprofen or any other anti-inflammatory medications while taking the meloxicam. I sent more meloxicam medication to your pharmacy.  ? ??? ?? ??? ??? ??? ??? ? ????????? ????? ??????? ?? ???? ????? ??? ???? ?? ??????? ???. ? ?????? 3 ?????? ????? ????????? ?? ???? ?????. ? ????????? ?????? ?????? ?????????? ?? ??? ??  ?????? ?? ???? ?? ????. ?? ????? ??????? ?? ??? ????????? ???? ??????.  Meloxicam ??????????  I have referred you to [facility] for therapy.  Someone from their office should be calling you in 1 to 2 weeks to schedule an appointment.  If you do not hear from them, let us know.   I have referred you to physical therapy for evaluation and treatment of your sciatica.  Someone from their office should be calling you in 1 to 2 weeks to schedule an appointment.  As above, if you do not hear from them, let us know.  ?? ???? ? ?????? ????? ? ????? ??? ?????? ?? ???? ??? ??. ? ??? ? ???? ??? ?? ??? ???? ???? ?? ?? 1 ??? ?? 2 ????? ???? ?????? ???? ???? ? ????? ??????? ????? ???. ?? ???? ? ??? ??? ?? ????? ??? ?? ??? ?????.  ?? ????? ? ???????? ?????? ?? ?????? ????? ????? ?????? ?? ???? ???. ? ??? ? ???? ??? ?? ??? ???? ???? ?? ?? 1 ??? ?? 2 ????? ???? ?????? ???? ???? ? ????? ??????? ????? ???. ??? ???? ?? ?????? ?? ???? ? ??? ??? ?? ????????  ??? ?? ??? ?????.   Please bring all of your medications to every appointment! If you have any questions or concerns, please do not hesitate to contact us via phone or MyChart message.  ??????? ???? ?? ?????? ?? ??? ??? ???? ?????! ?? ???? ???? ?????? ?? ??????? ???? ??????? ???? ? ?????? ?? MyChart ????? ?? ???? ??? ??? ????? ?????.  Ezequiel Essex, MD

## 2022-09-10 NOTE — Progress Notes (Signed)
    SUBJECTIVE:   CHIEF COMPLAINT / HPI:   Hameed - in person Pashto interpreter utilized for entirety of visit  Back and leg pain Last seen by myself in [facility] 08/29/2022.  Recommend starting meloxicam and follow-up in 2 to 3 weeks.  Also [facility] for future counseling services.  Today he reports he was able to get the meloxicam from the pharmacy and start therapy.  He has missed a couple doses due to not being at home, reports he has 2 more weeks left of the medicine.  He does not believe the medicine is working very well for him at all.  He was also able to see rheumatology 10/13 with Dr. Estanislado Pandy, who reviewed his extensive work-up thus far and did order an HLA-B27 (which has come back negative).  She recommends trial of meloxicam for 3 months.  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    OBJECTIVE:   BP 102/64   Pulse 63   Ht $R'5\' 8"'wI$  (1.727 m)   Wt 131 lb 3.2 oz (59.5 kg)   SpO2 96%   BMI 19.95 kg/m    PHQ-9:     09/10/2022    3:01 PM 08/29/2022    9:52 AM 07/18/2021    9:35 AM  Depression screen PHQ 2/9  Decreased Interest 0 0 0  Down, Depressed, Hopeless 0 0 0  PHQ - 2 Score 0 0 0  Altered sleeping 0 0 0  Tired, decreased energy 0 0 0  Change in appetite 0 0 0  Feeling bad or failure about yourself  0 0 0  Trouble concentrating 0 0 0  Moving slowly or fidgety/restless 0 0 0  Suicidal thoughts 0 0 0  PHQ-9 Score 0 0 0  Difficult doing work/chores Not difficult at all  Not difficult at all    Physical  Exam General: Awake, alert, oriented Cardiovascular: Regular rate and rhythm, S1 and S2 present, no murmurs auscultated Respiratory: Lung fields clear to auscultation bilaterally Extremities: No bilateral lower extremity edema, palpable pedal and pretibial pulses bilaterally, straight leg test negative on right but positive on left, no TTP over left trochanteric bursa  ASSESSMENT/PLAN:   Somatic dysfunction of spine, sacral Continue meloxicam trial for 3 months total.  Return in 2 to 2.5 months for follow-up.  Refer to PT for specific sciatic exercises.  We will follow-up with [facility] to see the progress of the referral for counseling services.     Ezequiel Essex, MD [place]

## 2022-09-11 ENCOUNTER — Other Ambulatory Visit: Payer: Self-pay

## 2022-09-11 ENCOUNTER — Ambulatory Visit: Payer: Medicaid Other | Attending: Rheumatology

## 2022-09-11 DIAGNOSIS — G8929 Other chronic pain: Secondary | ICD-10-CM | POA: Diagnosis present

## 2022-09-11 DIAGNOSIS — M533 Sacrococcygeal disorders, not elsewhere classified: Secondary | ICD-10-CM | POA: Insufficient documentation

## 2022-09-11 DIAGNOSIS — M5442 Lumbago with sciatica, left side: Secondary | ICD-10-CM | POA: Insufficient documentation

## 2022-09-11 DIAGNOSIS — M7062 Trochanteric bursitis, left hip: Secondary | ICD-10-CM | POA: Diagnosis not present

## 2022-09-11 DIAGNOSIS — M6281 Muscle weakness (generalized): Secondary | ICD-10-CM | POA: Insufficient documentation

## 2022-09-11 DIAGNOSIS — M5430 Sciatica, unspecified side: Secondary | ICD-10-CM | POA: Diagnosis not present

## 2022-09-12 NOTE — Assessment & Plan Note (Signed)
Continue meloxicam trial for 3 months total.  Return in 2 to 2.5 months for follow-up.  Refer to PT for specific sciatic exercises.  We will follow-up with [facility] to see the progress of the referral for counseling services.

## 2022-09-19 ENCOUNTER — Ambulatory Visit: Payer: Medicaid Other | Attending: Rheumatology

## 2022-09-19 DIAGNOSIS — M533 Sacrococcygeal disorders, not elsewhere classified: Secondary | ICD-10-CM | POA: Insufficient documentation

## 2022-09-19 DIAGNOSIS — M6281 Muscle weakness (generalized): Secondary | ICD-10-CM | POA: Insufficient documentation

## 2022-09-19 DIAGNOSIS — G8929 Other chronic pain: Secondary | ICD-10-CM | POA: Diagnosis present

## 2022-09-19 DIAGNOSIS — M5442 Lumbago with sciatica, left side: Secondary | ICD-10-CM | POA: Diagnosis not present
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
## 2022-09-27 ENCOUNTER — Ambulatory Visit: Payer: Medicaid Other

## 2022-09-27 DIAGNOSIS — M5442 Lumbago with sciatica, left side: Secondary | ICD-10-CM

## 2022-09-27 DIAGNOSIS — M6281 Muscle weakness (generalized): Secondary | ICD-10-CM
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
## 2022-09-30 IMAGING — CT CT PELVIS W/O CM
2 of 6 series · 11 of 46 positions shown, 12 images · non-contrast
Comparison: None Available.

CLINICAL DATA: Low back pain, sciatic pain LT side

EXAM:
CT PELVIS WITHOUT CONTRAST
TECHNIQUE: Multidetector CT imaging of the pelvis was performed following the
standard protocol without intravenous contrast.
RADIATION DOSE REDUCTION: This exam was performed according to the
departmental dose-optimization program which includes automated
exposure control, adjustment of the mA and/or kV according to
patient size and/or use of iterative reconstruction technique.

[Series 8: pelvis 2.00 br40 s3 axial st · axial · 0.40mm/px · z∈[+1233,+1447]mm · 8 of 125 slices shown, 9 images (1 of 2)]
[im 9/125  soft-tissue]
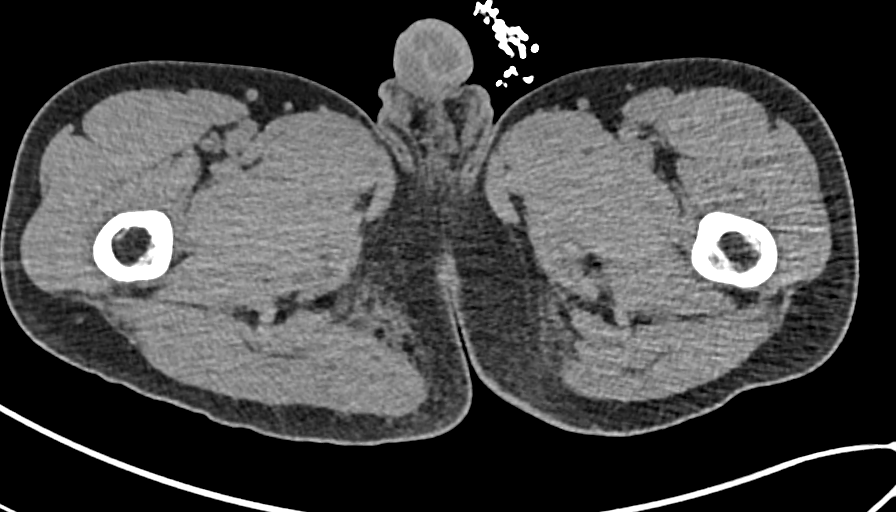
[im 9/125  bone]
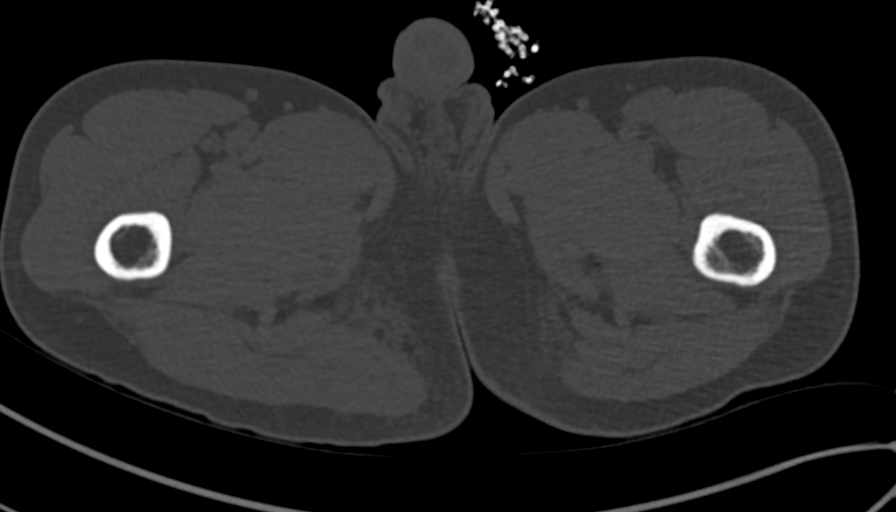
[im 25/125  soft-tissue]
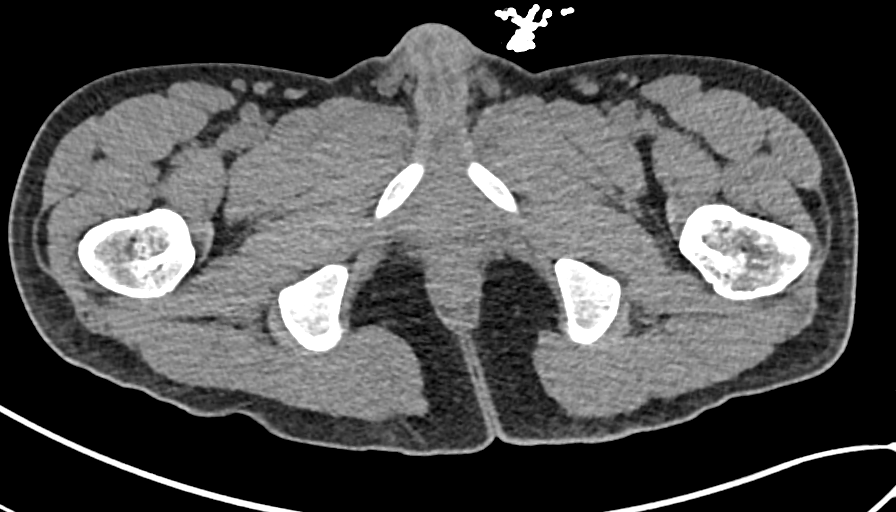
[im 42/125  soft-tissue]
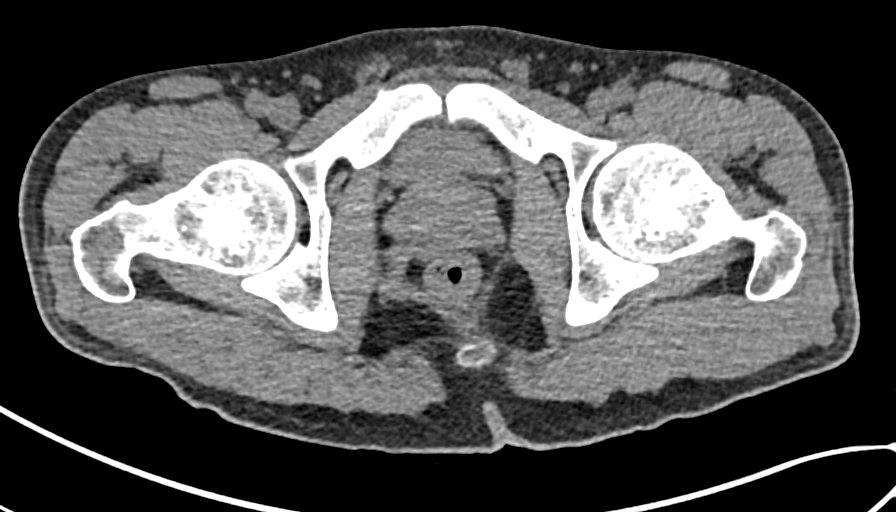
[im 58/125  soft-tissue]
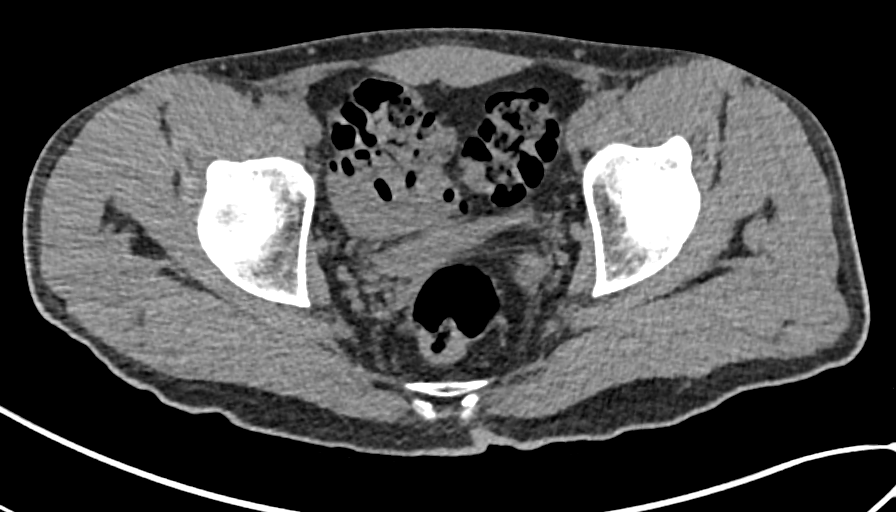
[im 67/125  soft-tissue]
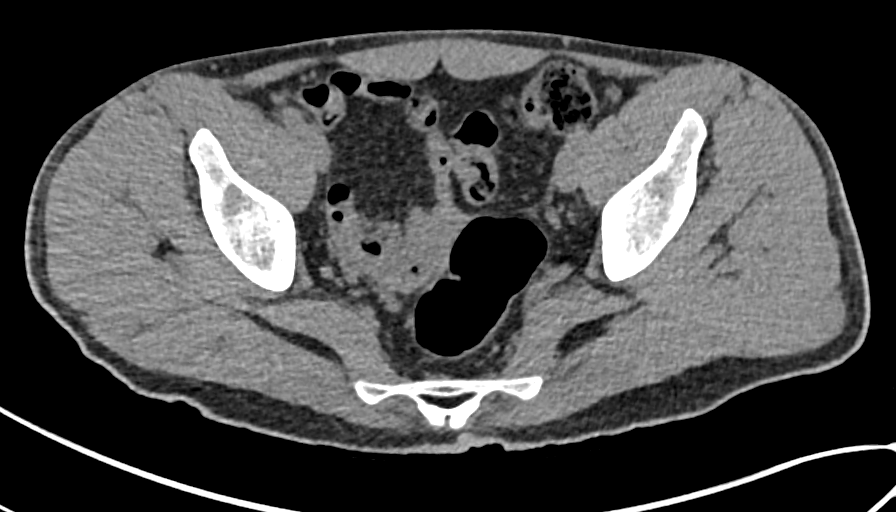
[im 83/125  soft-tissue]
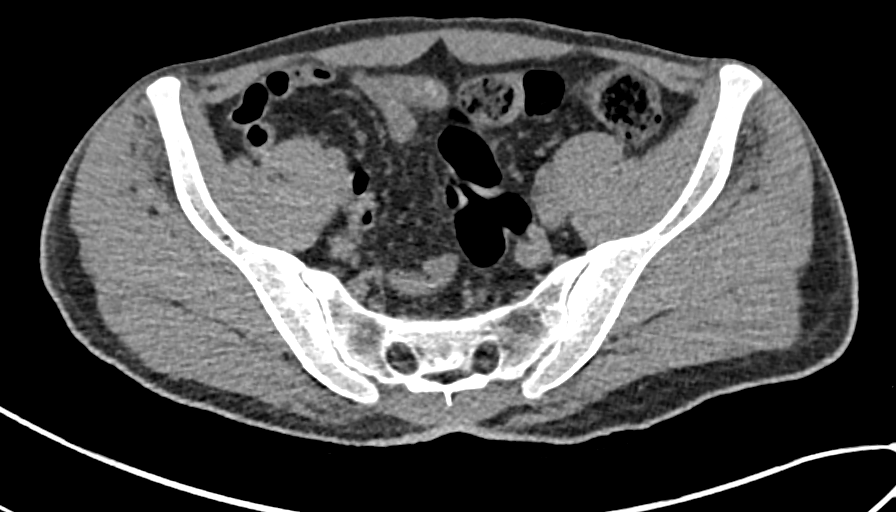
[im 100/125  soft-tissue]
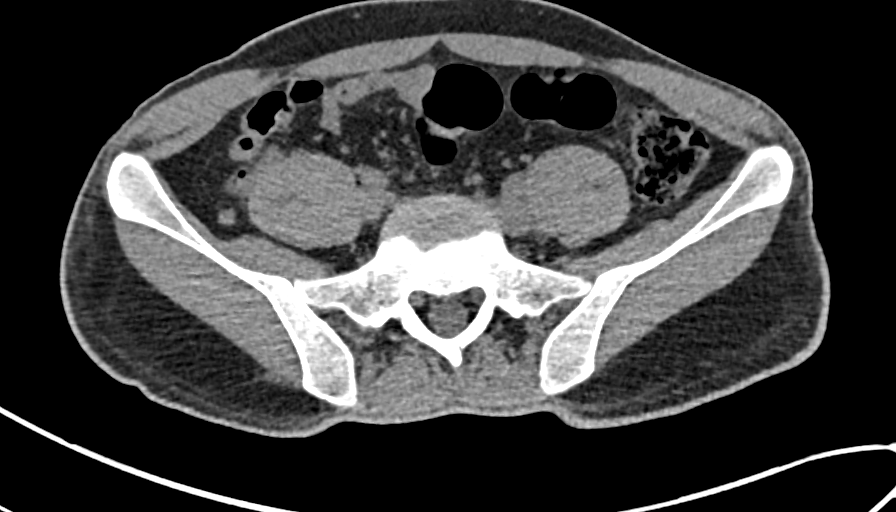
[im 116/125  soft-tissue]
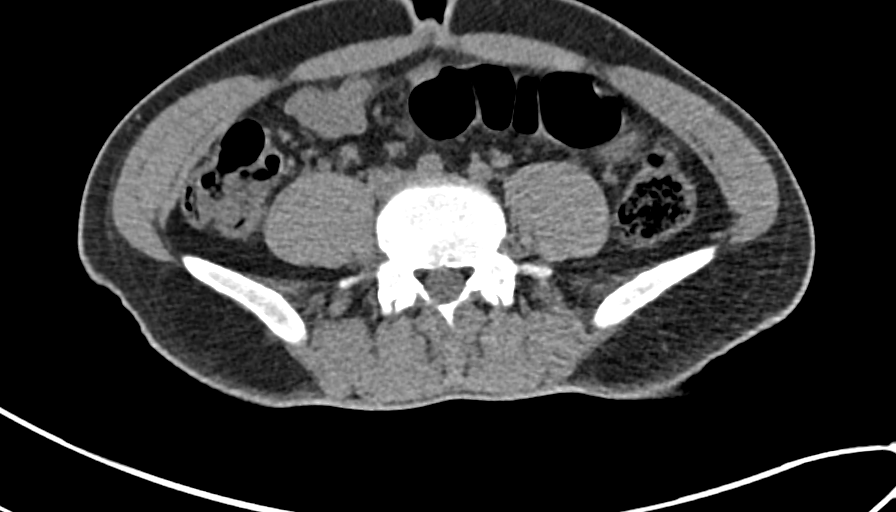

[Series 10: pelvis 2.00 br40 s3 axial st · coronal · 0.49mm/px · 3 of 99 slices shown (2 of 2)]
[im 25/99  soft-tissue]
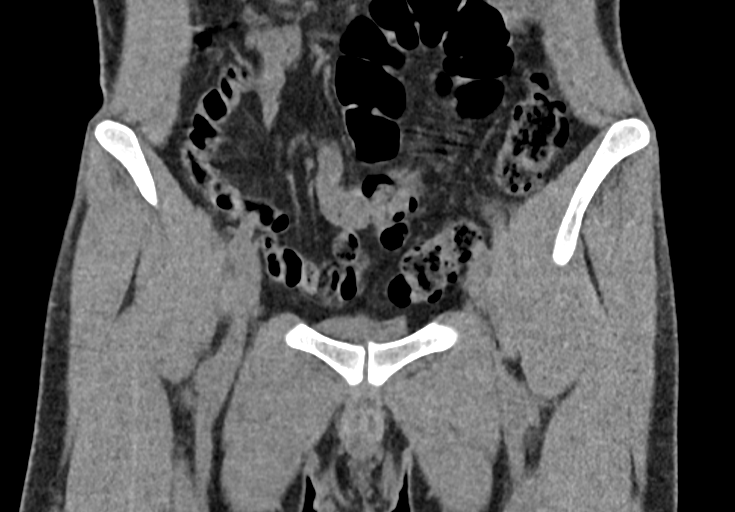
[im 50/99  soft-tissue]
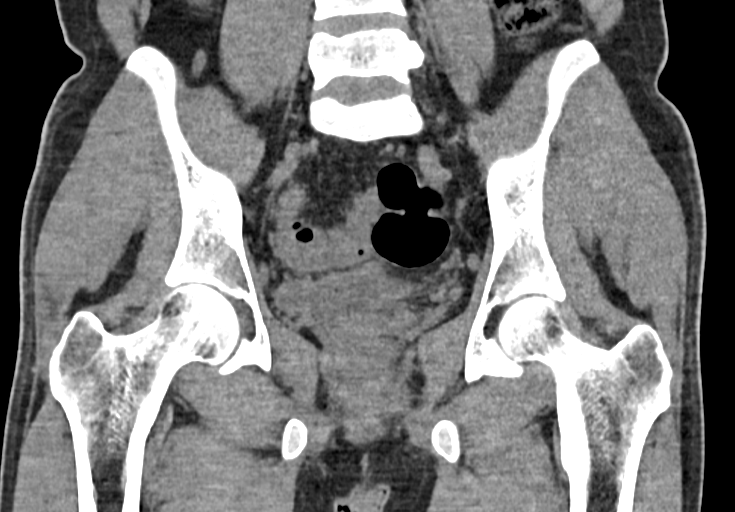
[im 74/99  soft-tissue]
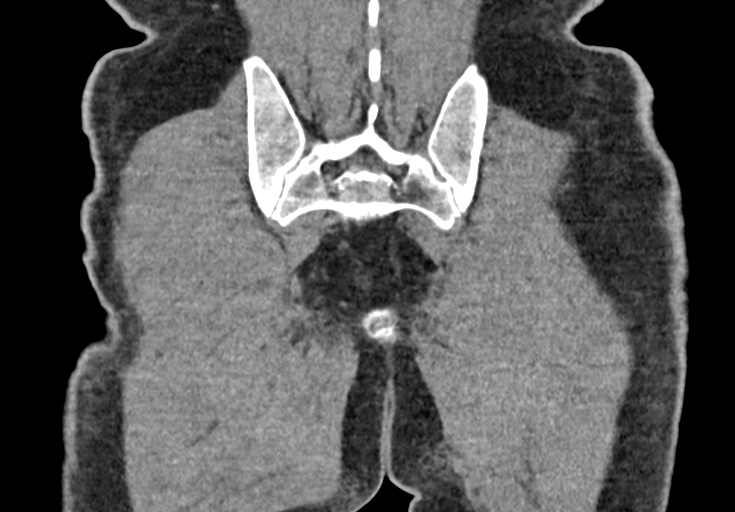

[11 of 46 positions shown; findings below may reference images not displayed]

FINDINGS: Urinary Tract:  No abnormality visualized.

Bowel:  Unremarkable visualized pelvic bowel loops.

Vascular/Lymphatic: No pathologically enlarged lymph nodes. No
significant vascular abnormality seen.

Reproductive:  No mass or other significant abnormality

Other: No intraperitoneal free fluid. No intraperitoneal free gas.
No organized fluid collection.

Musculoskeletal: No suspicious bone lesions identified.
IMPRESSION: Unremarkable CT pelvis noncontrast.

## 2022-10-04 ENCOUNTER — Ambulatory Visit: Payer: Medicaid Other

## 2022-10-04 DIAGNOSIS — G8929 Other chronic pain: Secondary | ICD-10-CM

## 2022-10-04 DIAGNOSIS — M6281 Muscle weakness (generalized): Secondary | ICD-10-CM

## 2022-10-04 DIAGNOSIS — M5442 Lumbago with sciatica, left side: Secondary | ICD-10-CM | POA: Diagnosis not present

## 2022-10-04 DIAGNOSIS — M533 Sacrococcygeal disorders, not elsewhere classified: Secondary | ICD-10-CM
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
## 2022-10-16 ENCOUNTER — Ambulatory Visit: Payer: Medicaid Other

## 2022-10-16 DIAGNOSIS — G8929 Other chronic pain: Secondary | ICD-10-CM

## 2022-10-16 DIAGNOSIS — M533 Sacrococcygeal disorders, not elsewhere classified: Secondary | ICD-10-CM

## 2022-10-16 DIAGNOSIS — M6281 Muscle weakness (generalized): Secondary | ICD-10-CM
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
## 2022-10-22 ENCOUNTER — Ambulatory Visit: Payer: Medicaid Other | Attending: Rheumatology

## 2022-10-22 DIAGNOSIS — G8929 Other chronic pain: Secondary | ICD-10-CM | POA: Diagnosis present

## 2022-10-22 DIAGNOSIS — M533 Sacrococcygeal disorders, not elsewhere classified: Secondary | ICD-10-CM | POA: Diagnosis present

## 2022-10-22 DIAGNOSIS — M6281 Muscle weakness (generalized): Secondary | ICD-10-CM | POA: Insufficient documentation

## 2022-10-22 DIAGNOSIS — M5442 Lumbago with sciatica, left side: Secondary | ICD-10-CM | POA: Insufficient documentation
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
## 2022-10-28 ENCOUNTER — Ambulatory Visit: Payer: Medicaid Other | Admitting: Physical Therapy

## 2022-10-28 ENCOUNTER — Encounter: Payer: Self-pay | Admitting: Physical Therapy

## 2022-10-28 DIAGNOSIS — M5442 Lumbago with sciatica, left side: Secondary | ICD-10-CM | POA: Diagnosis not present

## 2022-10-28 DIAGNOSIS — M6281 Muscle weakness (generalized): Secondary | ICD-10-CM

## 2022-10-28 DIAGNOSIS — G8929 Other chronic pain: Secondary | ICD-10-CM
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
## 2022-11-01 ENCOUNTER — Ambulatory Visit: Payer: Medicaid Other | Admitting: Family Medicine

## 2022-11-01 ENCOUNTER — Encounter: Payer: Self-pay | Admitting: Family Medicine

## 2022-11-01 VITALS — BP 118/72 | HR 63 | Ht 68.0 in | Wt 131.0 lb

## 2022-11-01 DIAGNOSIS — M533 Sacrococcygeal disorders, not elsewhere classified: Secondary | ICD-10-CM | POA: Diagnosis not present

## 2022-11-01 DIAGNOSIS — M9904 Segmental and somatic dysfunction of sacral region: Secondary | ICD-10-CM | POA: Diagnosis present

## 2022-11-01 DIAGNOSIS — M5430 Sciatica, unspecified side: Secondary | ICD-10-CM

## 2022-11-01 MED ORDER — MELOXICAM 15 MG PO TABS: 15.0000 mg | ORAL_TABLET | Freq: Every day | ORAL | 1 refills | Status: DC | PRN

## 2022-11-01 NOTE — Patient Instructions (Addendum)
I have translated the following text using Google translate.  As such, there are many errors.  I apologize for the poor written translation; however, we do not have written  translation services yet. It was wonderful to see you today. Thank you for allowing me to be a part of your care. Below is a short summary of what we discussed at your visit today: ?? ????? ??? ? ???? ????? ?? ?????? ??? ?????? ??. ?? ?? ????? ???? ???? ???? ???. ?? ? ???? ???? ??? ????? ????? ????? ?????? ?? ??????? ??? ?? ???? ? ????? ????? ??????? ?? ???. ?? ?? ??? ???? ?? ?? ?? ??? ???? ?????. ???? ?? ?? ?? ????? ????? ?? ????? ? ??????? ???? ??. ????? ? ??? ?? ????? ?? ?? ??? ?? ????? ?? ????? ?? ??? ???:  Back and leg pain Continue the physical therapy program. Once this had ended, do the exercises at home to maintain the progress you've made.  Take the anti-inflammatory medicine Meloxicam once a day as needed for a bad flare of the pain that interferes with activities.  You can take the stomach acid medicine with the meloxicam to prevent your acid reflux.  ? ?? ?? ??? ??? ? ????? ?????? ??????? ?? ???? ?????. ???? ?? ?? ??? ?? ??????? ?? ??? ?? ???????? ???? ???? ??? ?????? ????? ?? ???? ?? ???. ? ?????? ?? ???? Meloxicam ?? ??? ?? ?? ?? ????? ??? ???? ?? ? ??? ? ???? ??? ????? ????? ?? ?? ?? ????????? ?? ?????? ???. ???? ???? ?? ? ???? ????? ???? ? ????????? ??? ????? ???? ????? ? ????? ?????? ??? ?????.   Please bring all of your medications to every appointment! If you have any questions or concerns, please do not hesitate to contact us via phone or MyChart message.  ??????? ???? ?? ?????? ?? ??? ??? ???? ?????! ?? ???? ???? ?????? ?? ??????? ???? ??????? ???? ? ?????? ?? MyChart ????? ?? ???? ??? ??? ????? ?????.  Fayette Pho, MD

## 2022-11-01 NOTE — Progress Notes (Signed)
    SUBJECTIVE:   CHIEF COMPLAINT / HPI:   Back and leg pain follow-up 20-month follow-up.  Previously seen by rheumatology 10/13 recommended for meloxicam x 3 months.  Previous extensive workup without revealing underlying etiology.   He has been attending physical therapy with great success.  He still has 2 more weeks left of his therapy regimen.  He reports drastic improvement in his pain, and now only endorses occasional flares.  He does say there is a burning pain intermittently, can be worse with laying down or with movement.  He only took the meloxicam for about 1 month before he misplaced it.  He seems to be much better spirits today.  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  Only completed 1 month of meloxicam therapy, but this appears to have reduce the inflammation well enough for PT to be beneficial.  Recommend he finish out his course of physical therapy and continue to do the exercises at home afterwards.  I will represcribe meloxicam for as needed use, explained I wish that he only uses as needed.  Discussed risk of gastric  ulcer.     , MD [facility]

## 2022-11-01 NOTE — Assessment & Plan Note (Signed)
Greatly improved with meloxicam and physical therapy.  Only completed 1 month of meloxicam therapy, but this appears to have reduce the inflammation well enough for PT to be beneficial.  Recommend he finish out his course of physical therapy and continue to do the exercises at home afterwards.  I will represcribe meloxicam for as needed use, explained I wish that he only uses as needed.  Discussed risk of gastric ulcer.

## 2022-11-05 ENCOUNTER — Encounter (HOSPITAL_COMMUNITY): Payer: Self-pay

## 2022-11-05 ENCOUNTER — Ambulatory Visit (HOSPITAL_COMMUNITY)
Admission: EM | Admit: 2022-11-05 | Discharge: 2022-11-05 | Disposition: A | Discharge status: Home / Self Care | Payer: Medicaid Other | Attending: Emergency Medicine | Admitting: Emergency Medicine

## 2022-11-05 DIAGNOSIS — R21 Rash and other nonspecific skin eruption: Secondary | ICD-10-CM | POA: Diagnosis not present

## 2022-11-05 MED ORDER — CLOTRIMAZOLE 1 % EX CREA: TOPICAL_CREAM | CUTANEOUS | 0 refills | Status: DC

## 2022-11-05 MED ORDER — CEPHALEXIN 500 MG PO CAPS: 500.0000 mg | ORAL_CAPSULE | Freq: Two times a day (BID) | ORAL | 0 refills | Status: DC
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
## 2022-11-05 NOTE — ED Triage Notes (Signed)
Per interpreter, pt has a red round raised rash to lt ring finger for 4 days. States used ointment with no changes.

## 2022-11-05 NOTE — Discharge Instructions (Addendum)
Clotrimazole cream has been sent to the pharmacy, you will place this on the affected area 2 times daily for the next 2 weeks.  Keflex has been sent to the pharmacy, you will take this 2 times daily for the next 5 days.   If symptoms are not improving please return to the clinic for follow-up.

## 2022-11-06 ENCOUNTER — Ambulatory Visit: Payer: Medicaid Other

## 2022-11-06 DIAGNOSIS — M533 Sacrococcygeal disorders, not elsewhere classified: Secondary | ICD-10-CM

## 2022-11-06 DIAGNOSIS — G8929 Other chronic pain: Secondary | ICD-10-CM

## 2022-11-06 DIAGNOSIS — M6281 Muscle weakness (generalized): Secondary | ICD-10-CM

## 2022-11-06 DIAGNOSIS — M5442 Lumbago with sciatica, left side: Secondary | ICD-10-CM | POA: Diagnosis not present
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
## 2022-11-09 ENCOUNTER — Other Ambulatory Visit: Payer: Self-pay | Admitting: Family Medicine

## 2022-11-09 DIAGNOSIS — M533 Sacrococcygeal disorders, not elsewhere classified: Secondary | ICD-10-CM

## 2022-11-09 DIAGNOSIS — M5430 Sciatica, unspecified side: Secondary | ICD-10-CM

## 2022-11-20 ENCOUNTER — Ambulatory Visit: Payer: Medicaid Other | Attending: Rheumatology

## 2022-11-20 DIAGNOSIS — M533 Sacrococcygeal disorders, not elsewhere classified: Secondary | ICD-10-CM

## 2022-11-20 DIAGNOSIS — M5442 Lumbago with sciatica, left side: Secondary | ICD-10-CM | POA: Insufficient documentation

## 2022-11-20 DIAGNOSIS — M6281 Muscle weakness (generalized): Secondary | ICD-10-CM

## 2022-11-20 DIAGNOSIS — G8929 Other chronic pain: Secondary | ICD-10-CM
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
## 2022-11-25 ENCOUNTER — Other Ambulatory Visit: Payer: Self-pay | Admitting: Family Medicine

## 2022-11-25 DIAGNOSIS — M5430 Sciatica, unspecified side: Secondary | ICD-10-CM

## 2022-11-25 DIAGNOSIS — M533 Sacrococcygeal disorders, not elsewhere classified: Secondary | ICD-10-CM
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
## 2022-11-29 ENCOUNTER — Other Ambulatory Visit: Payer: Self-pay

## 2022-11-29 DIAGNOSIS — M533 Sacrococcygeal disorders, not elsewhere classified: Secondary | ICD-10-CM

## 2022-11-29 DIAGNOSIS — M5430 Sciatica, unspecified side: Secondary | ICD-10-CM

## 2022-12-05 ENCOUNTER — Ambulatory Visit: Payer: Medicaid Other | Attending: Rheumatology | Admitting: Rheumatology

## 2022-12-05 ENCOUNTER — Encounter: Payer: Self-pay | Admitting: Rheumatology

## 2022-12-05 VITALS — BP 120/79 | HR 52 | Resp 16 | Ht 67.0 in | Wt 133.0 lb

## 2022-12-05 DIAGNOSIS — G5702 Lesion of sciatic nerve, left lower limb: Secondary | ICD-10-CM | POA: Diagnosis not present

## 2022-12-05 DIAGNOSIS — M5442 Lumbago with sciatica, left side: Secondary | ICD-10-CM | POA: Diagnosis not present

## 2022-12-05 DIAGNOSIS — M7062 Trochanteric bursitis, left hip: Secondary | ICD-10-CM | POA: Diagnosis not present

## 2022-12-05 DIAGNOSIS — G8929 Other chronic pain: Secondary | ICD-10-CM

## 2022-12-05 DIAGNOSIS — M461 Sacroiliitis, not elsewhere classified: Secondary | ICD-10-CM

## 2022-12-05 DIAGNOSIS — Z789 Other specified health status: Secondary | ICD-10-CM

## 2022-12-05 DIAGNOSIS — E559 Vitamin D deficiency, unspecified: Secondary | ICD-10-CM

## 2022-12-05 DIAGNOSIS — M9904 Segmental and somatic dysfunction of sacral region: Secondary | ICD-10-CM

## 2022-12-05 NOTE — Patient Instructions (Signed)
Iliotibial Band Syndrome Rehab Ask your health care provider which exercises are safe for you. Do exercises exactly as told by your health care provider and adjust them as directed. It is normal to feel mild stretching, pulling, tightness, or discomfort as you do these exercises. Stop right away if you feel sudden pain or your pain gets significantly worse. Do not begin these exercises until told by your health care provider. Stretching and range-of-motion exercises These exercises warm up your muscles and joints and improve the movement and flexibility of your hip and pelvis. Quadriceps stretch, prone  Lie on your abdomen (prone position) on a firm surface, such as a bed or padded floor. Bend your left / right knee and reach back to hold your ankle or pant leg. If you cannot reach your ankle or pant leg, loop a belt around your foot and grab the belt instead. Gently pull your heel toward your buttocks. Your knee should not slide out to the side. You should feel a stretch in the front of your thigh and knee (quadriceps). Hold this position for __________ seconds. Repeat __________ times. Complete this exercise __________ times a day. Iliotibial band stretch An iliotibial band is a strong band of muscle tissue that runs from the outer side of your hip to the outer side of your thigh and knee. Lie on your side with your left / right leg in the top position. Bend both of your knees and grab your left / right ankle. Stretch out your bottom arm to help you balance. Slowly bring your top knee back so your thigh goes behind your trunk. Slowly lower your top leg toward the floor until you feel a gentle stretch on the outside of your left / right hip and thigh. If you do not feel a stretch and your knee will not fall farther, place the heel of your other foot on top of your knee and pull your knee down toward the floor with your foot. Hold this position for __________ seconds. Repeat __________ times.  Complete this exercise __________ times a day. Strengthening exercises These exercises build strength and endurance in your hip and pelvis. Endurance is the ability to use your muscles for a long time, even after they get tired. Straight leg raises, side-lying This exercise strengthens the muscles that rotate the leg at the hip and move it away from your body (hip abductors). Lie on your side with your left / right leg in the top position. Lie so your head, shoulder, hip, and knee line up. You may bend your bottom knee to help you balance. Roll your hips slightly forward so your hips are stacked directly over each other and your left / right knee is facing forward. Tense the muscles in your outer thigh and lift your top leg 4-6 inches (10-15 cm). Hold this position for __________ seconds. Slowly lower your leg to return to the starting position. Let your muscles relax completely before doing another repetition. Repeat __________ times. Complete this exercise __________ times a day. Leg raises, prone This exercise strengthens the muscles that move the hips backward (hip extensors). Lie on your abdomen (prone position) on your bed or a firm surface. You can put a pillow under your hips if that is more comfortable for your lower back. Bend your left / right knee so your foot is straight up in the air. Squeeze your buttocks muscles and lift your left / right thigh off the bed. Do not let your back arch. Tense   your thigh muscle as hard as you can without increasing any knee pain. Hold this position for __________ seconds. Slowly lower your leg to return to the starting position and allow it to relax completely. Repeat __________ times. Complete this exercise __________ times a day. Hip hike Stand sideways on a bottom step. Stand on your left / right leg with your other foot unsupported next to the step. You can hold on to a railing or wall for balance if needed. Keep your knees straight and your  torso square. Then lift your left / right hip up toward the ceiling. Slowly let your left / right hip lower toward the floor, past the starting position. Your foot should get closer to the floor. Do not lean or bend your knees. Repeat __________ times. Complete this exercise __________ times a day. This information is not intended to replace advice given to you by your health care provider. Make sure you discuss any questions you have with your health care provider. Document Revised: 01/12/2020 Document Reviewed: 01/12/2020 Elsevier Patient Education  [place]. Back Exercises The following exercises strengthen the muscles that help to support the trunk (torso) and back. They also help to keep the lower back flexible. Doing these exercises can help to prevent or lessen existing low back pain. If you have back pain or discomfort, try doing these exercises 2-3 times each day or as told by your health care provider. As your pain improves, do them once each day, but increase the number of times that you repeat the steps for each exercise (do more repetitions). To prevent the recurrence of back pain, continue to do these exercises once each day or as told by your health care provider. Do exercises exactly as told by your health care provider and adjust them as directed. It is normal to feel mild stretching, pulling, tightness, or discomfort as you do these exercises, but you should stop right away if you feel sudden pain or your pain gets worse. Exercises Single knee to chest Repeat these steps 3-5 times for each leg: Lie on your back on a firm bed or the floor with your legs extended. Bring one knee to your chest. Your other leg should stay extended and in contact with the floor. Hold your knee in place by grabbing your knee or thigh with both hands and hold. Pull on your knee until you feel a gentle stretch in your lower back or buttocks. Hold the stretch for 10-30 seconds. Slowly release  and straighten your leg.  Pelvic tilt Repeat these steps 5-10 times: Lie on your back on a firm bed or the floor with your legs extended. Bend your knees so they are pointing toward the ceiling and your feet are flat on the floor. Tighten your lower abdominal muscles to press your lower back against the floor. This motion will tilt your pelvis so your tailbone points up toward the ceiling instead of pointing to your feet or the floor. With gentle tension and even breathing, hold this position for 5-10 seconds.  Cat-cow Repeat these steps until your lower back becomes more flexible: Get into a hands-and-knees position on a firm bed or the floor. Keep your hands under your shoulders, and keep your knees under your hips. You may place padding under your knees for comfort. Let your head hang down toward your chest. Contract your abdominal muscles and point your tailbone toward the floor so your lower back becomes rounded like the back of a cat. Hold  this position for 5 seconds. Slowly lift your head, let your abdominal muscles relax, and point your tailbone up toward the ceiling so your back forms a sagging arch like the back of a cow. Hold this position for 5 seconds.  Press-ups Repeat these steps 5-10 times: Lie on your abdomen (face-down) on a firm bed or the floor. Place your palms near your head, about shoulder-width apart. Keeping your back as relaxed as possible and keeping your hips on the floor, slowly straighten your arms to raise the top half of your body and lift your shoulders. Do not use your back muscles to raise your upper torso. You may adjust the placement of your hands to make yourself more comfortable. Hold this position for 5 seconds while you keep your back relaxed. Slowly return to lying flat on the floor.  Bridges Repeat these steps 10 times: Lie on your back on a firm bed or the floor. Bend your knees so they are pointing toward the ceiling and your feet are flat on  the floor. Your arms should be flat at your sides, next to your body. Tighten your buttocks muscles and lift your buttocks off the floor until your waist is at almost the same height as your knees. You should feel the muscles working in your buttocks and the back of your thighs. If you do not feel these muscles, slide your feet 1-2 inches (2.5-5 cm) farther away from your buttocks. Hold this position for 3-5 seconds. Slowly lower your hips to the starting position, and allow your buttocks muscles to relax completely. If this exercise is too easy, try doing it with your arms crossed over your chest. Abdominal crunches Repeat these steps 5-10 times: Lie on your back on a firm bed or the floor with your legs extended. Bend your knees so they are pointing toward the ceiling and your feet are flat on the floor. Cross your arms over your chest. Tip your chin slightly toward your chest without bending your neck. Tighten your abdominal muscles and slowly raise your torso high enough to lift your shoulder blades a tiny bit off the floor. Avoid raising your torso higher than that because it can put too much stress on your lower back and does not help to strengthen your abdominal muscles. Slowly return to your starting position.  Back lifts Repeat these steps 5-10 times: Lie on your abdomen (face-down) with your arms at your sides, and rest your forehead on the floor. Tighten the muscles in your legs and your buttocks. Slowly lift your chest off the floor while you keep your hips pressed to the floor. Keep the back of your head in line with the curve in your back. Your eyes should be looking at the floor. Hold this position for 3-5 seconds. Slowly return to your starting position.  Contact a health care provider if: Your back pain or discomfort gets much worse when you do an exercise. Your worsening back pain or discomfort does not lessen within 2 hours after you exercise. If you have any of these  problems, stop doing these exercises right away. Do not do them again unless your health care provider says that you can. Get help right away if: You develop sudden, severe back pain. If this happens, stop doing the exercises right away. Do not do them again unless your health care provider says that you can. This information is not intended to replace advice given to you by your health care provider. Make sure you  discuss any questions you have with your health care provider. Document Revised: 05/01/2021 Document Reviewed: 01/17/2021 Elsevier Patient Education  [place].

## 2023-03-02 ENCOUNTER — Other Ambulatory Visit: Payer: Self-pay | Admitting: Family Medicine

## 2023-03-02 DIAGNOSIS — K219 Gastro-esophageal reflux disease without esophagitis: Secondary | ICD-10-CM

## 2023-03-08 ENCOUNTER — Other Ambulatory Visit: Payer: Self-pay | Admitting: Student

## 2023-03-08 DIAGNOSIS — M5430 Sciatica, unspecified side: Secondary | ICD-10-CM

## 2023-03-08 DIAGNOSIS — M533 Sacrococcygeal disorders, not elsewhere classified: Secondary | ICD-10-CM

## 2023-04-30 ENCOUNTER — Ambulatory Visit: Payer: BLUE CROSS/BLUE SHIELD | Admitting: Family Medicine

## 2023-04-30 VITALS — BP 114/68 | HR 75 | Ht 68.0 in | Wt 143.4 lb

## 2023-04-30 DIAGNOSIS — G5702 Lesion of sciatic nerve, left lower limb: Secondary | ICD-10-CM | POA: Diagnosis not present

## 2023-04-30 DIAGNOSIS — Z789 Other specified health status: Secondary | ICD-10-CM

## 2023-04-30 DIAGNOSIS — Z2839 Other underimmunization status: Secondary | ICD-10-CM | POA: Diagnosis not present

## 2023-04-30 DIAGNOSIS — M533 Sacrococcygeal disorders, not elsewhere classified: Secondary | ICD-10-CM

## 2023-04-30 DIAGNOSIS — M5430 Sciatica, unspecified side: Secondary | ICD-10-CM

## 2023-04-30 MED ORDER — DULOXETINE HCL 30 MG PO CPEP: ORAL_CAPSULE | ORAL | 0 refills | Status: DC

## 2023-04-30 MED ORDER — KETOROLAC TROMETHAMINE 30 MG/ML IJ SOLN
30.0000 mg | Freq: Once | INTRAMUSCULAR | Status: AC
  Administered 2023-04-30: 30 mg via INTRAMUSCULAR

## 2023-04-30 NOTE — Patient Instructions (Addendum)
It was wonderful to see you today. Thank you for allowing me to be a part of your care. Below is a short summary of what we discussed at your visit today: ?? ?? ??? ???? ?? ?? ?? ??? ???? ?????. ???? ?? ?? ?? ????? ????? ?? ????? ? ??????? ???? ??. ????? ? ??? ?? ????? ?? ?? ??? ?? ????? ?? ????? ?? ??? ???:  Pain Today you got a shot of medicine called toradol to reduce inflammation in the body.   Start the cymbalta (duloxetine). Take one capsule every day for 7 days. Then increase to two capsules every day. This medicine must be taken every single day. It will take 6 weeks to show improvement in pain.   Come back in 4 weeks to check in. Your appointment is Friday, July 12 at 9 am. You will see Dr. Miquel Dunn. We will request an in-person interpreter for this appointment.  ??? ?? ?? ? ??? ? ?????? ? ????? ????? ? ??????? ?? ??? ? ????? ??? ?????? ??.  cymbalta (duloxetine) ??? ???. ??? ??? ? 7 ???? ????? ?? ?????? ?????. ??? ??? ??? ??? ?????? ?? ??? ???. ?? ???? ???? ??? ??? ??????? ??. ?? ?? 6 ???? ??? ????? ???? ?? ??? ?? ?? ???? ????.  ?? 4 ????? ?? ????? ???? ?? ??? ?? ???. ????? ?????? ? ???? ?? ??? ? ????? ?? 12 ? ???? ?? 9 ??? ??. ???? ?? ????? ??? ?????. ??? ?? ? ?? ?????? ????? ?? ???? ???? ? ???????? ?????? ????.  Records We had you complete a form to request your medical records. We will mail the records to your house. You may take these to your agency for help.  ????????? ??? ???? ?? ?????? ?? ????? ? ??? ????????? ?????? ???? ????? ??? ????? ??? ???. ??? ?? ????????? ????? ??? ?? ?????. ???? ???? ?? ?? ? ????? ????? ???? ????? ?? ?????.  Vaccines Today you received the Hepatitis B vaccine. You may experience some residual soreness at the injection site.  Gentle stretches and regular use of that arm will help speed up your recovery.  As the vaccines are giving your immune system a "practice run" against specific infections, you may feel a little under the weather for the next several  days.  We recommend rest as needed and hydrating. ????????? ?? ???? ? ??????? ?? ?????? ?????? ??. ???? ???? ? ??????? ???? ?? ?? ?? ???? ??? ????? ???. ? ?? ???? ??? ?? ???? ??????? ?? ????? ? ????? ????? ???? ?? ????? ????. ??? ???? ?? ?????? ????? ? ?????? ????? ? ?????? ???????? ???????? "????? ??" ?????? ???? ???? ? ???????? ?? ???? ????? ? ??? ????? ?? ?? ????? ????. ??? ? ????? ??? ?? ??????? ?? ???????? ??????? ???.  Primary care doctor graduating soon! I am graduating June 28th. You will be assigned a new PCP.  Thank you for letting me care for you! It has been wonderful getting to know you.  ? ?????? ??????? ????? ??? ?? ???? ????! ?? ? ??? ?? 28 ???? ??? ??. ???? ?? ?? ?? ??? PCP ?????? ??. ???? ?? ?? ?? ????? ????? ?? ????? ??????? ????! ?? ?? ??? ???? ?? ?? ???? ??????.  Please bring all of your medications to every appointment! If you have any questions or concerns, please do not hesitate to contact us via phone or MyChart message.  ??????? ???? ?? ?????? ?? ??? ??? ???? ?????! ?? ???? ???? ?????? ?? ??????? ???? ??????? ???? ? ?????? ??  MyChart ????? ?? ???? ??? ??? ????? ?????.  Fayette Pho, MD

## 2023-04-30 NOTE — Progress Notes (Signed)
SUBJECTIVE:   CHIEF COMPLAINT / HPI:   Pashto interpreter Jeremy [name] ID # [phone] used for entirety of visit.   Back and left leg pain Jeremy [name] presents for worsening of his chronic low back and left leg pain and burning.  He reports it has become difficult to work because he can only stand for half hour or so.  He is quite concerned that he will have to stop working in the near future.  Also painful to sit for too long, and with intercourse.   Last PT January 2024. PT did help some pain but the burning is still present.   Previously tried amitriptyline and gabapentin without relief.  Tylenol and meloxicam make his stomach hurt without providing much pain relief.   Specialists: - Neurology previously. Normal nerve conduction testing.  - Sports medicine previously. S/p SI joint injection, which helped only briefly.  - Last saw rheum 12/05/22. Autoimmune labs negative.   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    OBJECTIVE:   BP 114/68   Pulse 75   Ht 5\' 8"  (1.727 m)   Wt 143 lb 6.4 oz (65 kg)   SpO2 97%   BMI 21.80 kg/m    PHQ-9:     04/30/2023    9:09 AM 11/01/2022    9:12 AM 09/10/2022    3:01 PM  Depression screen PHQ 2/9  Decreased Interest 0 0 0  Down, Depressed, Hopeless 0 0 0  PHQ - 2 Score 0 0 0  Altered sleeping 0 0 0   Tired, decreased energy 0 0 0  Change in appetite 0 0 0  Feeling bad or failure about yourself  0 0 0  Trouble concentrating 0 0 0  Moving slowly or fidgety/restless 0 0 0  Suicidal thoughts 0 0 0  PHQ-9 Score 0 0 0  Difficult doing work/chores Not difficult at all Not difficult at all Not difficult at all    Physical Exam General: Awake, alert, oriented, no acute distress Respiratory: Unlabored respirations, speaking in full sentences, no respiratory distress Extremities: Moving all extremities spontaneously Neuro: Cranial nerves II through X grossly intact Psych: Normal insight and judgement   ASSESSMENT/PLAN:   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 Will draw blood today to ensure conferred immunity.   Sciatic leg pain Suspect the burning leg pain on left is his sciatica. Patient has previously been evaluated and treated by sports medicine, neurology, rheumatology, and physical therapy. Amitriptyline and gabapentin without relief in the past. Tylenol and Meloxicam produce GI upset. I am hesitant to send him to a pain clinic, but this may be a reasonable next step if the pain is so bad he fears he cannot work and provide for his family.  - IM toradol today - stop amitriptyline - stop meloxicam - start cymbalta 30  mg daily for 7 days then increase to treatment dose of 60 mg - follow up in 4 weeks or so for tolerance - discussed that it may take 4-6 weeks for relief    Fayette Pho, MD [facility]

## 2023-05-01 LAB — BASIC METABOLIC PANEL
BUN/Creatinine Ratio: 11 (ref 9–20)
BUN: 9 mg/dL (ref 6–20)
CO2: 24 mmol/L (ref 20–29)
Calcium: 9.8 mg/dL (ref 8.7–10.2)
Chloride: 103 mmol/L (ref 96–106)
Creatinine, Ser: 0.79 mg/dL (ref 0.76–1.27)
Glucose: 99 mg/dL (ref 70–99)
Potassium: 4.1 mmol/L (ref 3.5–5.2)
Sodium: 140 mmol/L (ref 134–144)
eGFR: 124 mL/min/{1.73_m2} (ref >59)

## 2023-05-01 LAB — HEPATITIS B SURFACE ANTIBODY,QUALITATIVE: Hep B Surface Ab, Qual: REACTIVE

## 2023-05-01 LAB — HEPATITIS B CORE AB W/REFLEX: Hep B Core Total Ab: NEGATIVE

## 2023-05-01 LAB — HEPATITIS B SURFACE ANTIGEN: Hepatitis B Surface Ag: NEGATIVE

## 2023-05-02 ENCOUNTER — Encounter: Payer: Self-pay | Admitting: Family Medicine

## 2023-05-02 NOTE — Assessment & Plan Note (Signed)
S/p 3 vaccines upon arrival to [facility] and establishing here. Will draw blood today to ensure conferred immunity.

## 2023-05-02 NOTE — Assessment & Plan Note (Addendum)
Suspect the burning leg pain on left is his sciatica. Patient has previously been evaluated and treated by sports medicine, neurology, rheumatology, and physical therapy. Amitriptyline and gabapentin without relief in the past. Tylenol and Meloxicam produce GI upset. I am hesitant to send him to a pain clinic, but this may be a reasonable next step if the pain is so bad he fears he cannot work and provide for his family.  - IM toradol today - stop amitriptyline - stop meloxicam - start cymbalta 30 mg daily for 7 days then increase to treatment dose of 60 mg - follow up in 4 weeks or so for tolerance - discussed that it may take 4-6 weeks for relief

## 2023-05-28 NOTE — Progress Notes (Signed)
    SUBJECTIVE:   CHIEF COMPLAINT / HPI:   Sciatic leg pain- continues to have pain on his left side that radiates down his leg. No bowel or bladder incontinence, or saddle paresthesias. Had SI injection in the past that helped at first but then made things worse. Cymbalta 60 mg daily not helping. He notes that he got a paper script from [place] and is wondering if he can take these medications as well. One is pregabalin but the others are names I can't read.  Patient has previously been evaluated and treated by sports medicine, neurology, rheumatology, and physical therapy. Amitriptyline and gabapentin without relief in the past. Tylenol and Meloxicam produce GI upset.  PERTINENT  PMH / PSH: GERD, sciatica  OBJECTIVE:   BP (!) 129/95   Pulse 62   Ht 5\' 7"  (1.702 m)   Wt 144 lb 6.4 oz (65.5 kg)   SpO2 100%   BMI 22.62 kg/m   General: alert & oriented, no apparent distress, well groomed HEENT: normocephalic, atraumatic, EOM grossly intact, oral mucosa moist, neck supple Respiratory: normal respiratory effort GI: non-distended Skin: no rashes, no jaundice MSK: antalgic gait, no lumbar spine tenderness to palpation, Left SI joint tenderness to palpation Psych: appropriate mood and affect  ASSESSMENT/PLAN:   Sciatic leg pain Cymbalta not helping, has trialed many other medications, will discontinue at this time, discussed realistic that we may not be able to make pain go away completely but can work on symptom reduction No red flag signs or symptoms, has seen many specialists, previously trialed PT Referral to IR for possible complete left SI joint RFA as previously recommended by IR/sports medicine Offered small dose of Tramadol PRN to use to get through to this referral appt, cautioned to not take while driving or using machinery Discussed that I do not recommend taking medications from [place] that I cannot read with this medication due to risk of interaction/side  effect/sedation Continue diclofenac gel     Billey Co, MD [facility]

## 2023-05-30 ENCOUNTER — Encounter: Payer: Self-pay | Admitting: Family Medicine

## 2023-05-30 ENCOUNTER — Ambulatory Visit: Payer: BLUE CROSS/BLUE SHIELD | Admitting: Family Medicine

## 2023-05-30 VITALS — BP 129/95 | HR 62 | Ht 67.0 in | Wt 144.4 lb

## 2023-05-30 DIAGNOSIS — M533 Sacrococcygeal disorders, not elsewhere classified: Secondary | ICD-10-CM

## 2023-05-30 DIAGNOSIS — M5430 Sciatica, unspecified side: Secondary | ICD-10-CM

## 2023-05-30 MED ORDER — TRAMADOL HCL 50 MG PO TABS: 50.0000 mg | ORAL_TABLET | Freq: Two times a day (BID) | ORAL | 0 refills | Status: AC | PRN

## 2023-05-30 NOTE — Patient Instructions (Addendum)
It was wonderful to see you today.  Please bring ALL of your medications with you to every visit.   Today we talked about:   ????:  ?? ???? ????? ?? ???? ????? ???? ? ???? ????? ?? ??? ??? ????. ??? ?? ???? ?? ? ?????? ????? ??? ????.  ??? ?? ?? ???????? ????? ???? ? ??? ??? ????? ? ????? ??? ?? ??? ????? ?? ??? ?? ??? ???. ?? ? ???? ????? ?? ? ???? ???????? ????? ?????? ?? ????.  ? Cymbalta ?????? ??? ???.  ? ????? ?????? ????? ???? ???? ???? ????? ????.  I sent a referral to neurosurgery to discuss nerve ablation. They will call you for an appointment.  We sent in some tramadol to try as needed for severe pain, up to twice a day. Do not take this while driving or operating heavy machinery.  Stop taking the Cymbalta.  Thank you for choosing [facility].   Please call [phone] with any questions about today's appointment.  Please arrive at least 15 minutes prior to your scheduled appointments.   If you had blood work today, I will send you a MyChart message or a letter if results are normal. Otherwise, I will give you a call.   If you had a referral placed, they will call you to set up an appointment. Please give [place] a call if you don't hear back in the next 2 weeks.   If you need additional refills before your next appointment, please call your pharmacy first.   Burley Saver, MD  Family Medicine

## 2023-05-30 NOTE — Assessment & Plan Note (Signed)
Cymbalta not helping, has trialed many other medications, will discontinue at this time, discussed realistic that we may not be able to make pain go away completely but can work on symptom reduction No red flag signs or symptoms, has seen many specialists, previously trialed PT Referral to IR for possible complete left SI joint RFA as previously recommended by IR/sports medicine Offered small dose of Tramadol PRN to use to get through to this referral appt, cautioned to not take while driving or using machinery Discussed that I do not recommend taking medications from [place] that I cannot read with this medication due to risk of interaction/side effect/sedation Continue diclofenac gel

## 2023-06-06 ENCOUNTER — Other Ambulatory Visit (HOSPITAL_COMMUNITY): Payer: Self-pay

## 2023-06-11 ENCOUNTER — Telehealth: Payer: Self-pay

## 2023-06-11 ENCOUNTER — Other Ambulatory Visit (HOSPITAL_COMMUNITY): Payer: Self-pay

## 2023-06-11 NOTE — Telephone Encounter (Signed)
Pharmacy Patient Advocate Encounter   Received notification from Fax that prior authorization for Tramadol is required/requested.   Insurance verification completed.   The patient is insured through [facility] .   Per test claim: PA submitted to [facility] via Prompt PA Key/confirmation #/[id number] Status is pending

## 2023-06-12 ENCOUNTER — Other Ambulatory Visit (HOSPITAL_COMMUNITY): Payer: Self-pay

## 2023-06-12 NOTE — Telephone Encounter (Signed)
Pharmacy Patient Advocate Encounter  Received notification from [employer] that Prior Authorization for TRAMADOL has been APPROVED from 06/11/23 to 12/12/23. Ran test claim, Copay is $0.56.
